# Patient Record
Sex: Female | Born: 1935 | Race: White | Hispanic: No | Smoking: Former smoker
Health system: Southern US, Community
[De-identification: ages and names within clinical notes are randomized; demographics above are authoritative.]

## PROBLEM LIST (undated history)

## (undated) DIAGNOSIS — R42 Dizziness and giddiness: Secondary | ICD-10-CM

## (undated) DIAGNOSIS — I4891 Unspecified atrial fibrillation: Secondary | ICD-10-CM

## (undated) DIAGNOSIS — C8590 Non-Hodgkin lymphoma, unspecified, unspecified site: Secondary | ICD-10-CM

## (undated) DIAGNOSIS — D352 Benign neoplasm of pituitary gland: Secondary | ICD-10-CM

## (undated) DIAGNOSIS — J449 Chronic obstructive pulmonary disease, unspecified: Secondary | ICD-10-CM

## (undated) DIAGNOSIS — K2270 Barrett's esophagus without dysplasia: Secondary | ICD-10-CM

## (undated) HISTORY — PX: PITUITARY SURGERY: SHX203

## (undated) HISTORY — PX: ORTHOPEDIC SURGERY: SHX850

## (undated) HISTORY — PX: ABDOMINAL HYSTERECTOMY: SHX81

---

## 2014-08-13 ENCOUNTER — Emergency Department: Payer: Medicare Other

## 2014-08-13 ENCOUNTER — Encounter: Payer: Self-pay | Admitting: *Deleted

## 2014-08-13 ENCOUNTER — Emergency Department
Admission: EM | Admit: 2014-08-13 | Discharge: 2014-08-13 | Disposition: A | Discharge status: Home / Self Care | Payer: Medicare Other | Attending: Emergency Medicine | Admitting: Emergency Medicine

## 2014-08-13 DIAGNOSIS — Z7951 Long term (current) use of inhaled steroids: Secondary | ICD-10-CM | POA: Diagnosis not present

## 2014-08-13 DIAGNOSIS — J189 Pneumonia, unspecified organism: Secondary | ICD-10-CM | POA: Diagnosis not present

## 2014-08-13 DIAGNOSIS — J449 Chronic obstructive pulmonary disease, unspecified: Secondary | ICD-10-CM | POA: Insufficient documentation

## 2014-08-13 DIAGNOSIS — Z880 Allergy status to penicillin: Secondary | ICD-10-CM | POA: Insufficient documentation

## 2014-08-13 DIAGNOSIS — Z7952 Long term (current) use of systemic steroids: Secondary | ICD-10-CM | POA: Insufficient documentation

## 2014-08-13 DIAGNOSIS — R0602 Shortness of breath: Secondary | ICD-10-CM | POA: Diagnosis present

## 2014-08-13 DIAGNOSIS — J181 Lobar pneumonia, unspecified organism: Secondary | ICD-10-CM

## 2014-08-13 DIAGNOSIS — Z79899 Other long term (current) drug therapy: Secondary | ICD-10-CM | POA: Insufficient documentation

## 2014-08-13 DIAGNOSIS — Z791 Long term (current) use of non-steroidal anti-inflammatories (NSAID): Secondary | ICD-10-CM | POA: Insufficient documentation

## 2014-08-13 HISTORY — DX: Non-Hodgkin lymphoma, unspecified, unspecified site: C85.90

## 2014-08-13 HISTORY — DX: Dizziness and giddiness: R42

## 2014-08-13 HISTORY — DX: Barrett's esophagus without dysplasia: K22.70

## 2014-08-13 HISTORY — DX: Unspecified atrial fibrillation: I48.91

## 2014-08-13 HISTORY — DX: Chronic obstructive pulmonary disease, unspecified: J44.9

## 2014-08-13 HISTORY — DX: Benign neoplasm of pituitary gland: D35.2

## 2014-08-13 LAB — COMPREHENSIVE METABOLIC PANEL
ALT: 22 U/L (ref 14–54)
ANION GAP: 9 (ref 5–15)
AST: 41 U/L (ref 15–41)
Albumin: 3 g/dL — ABNORMAL LOW (ref 3.5–5.0)
Alkaline Phosphatase: 192 U/L — ABNORMAL HIGH (ref 38–126)
BUN: 6 mg/dL (ref 6–20)
CO2: 29 mmol/L (ref 22–32)
Calcium: 8.2 mg/dL — ABNORMAL LOW (ref 8.9–10.3)
Chloride: 100 mmol/L — ABNORMAL LOW (ref 101–111)
Creatinine, Ser: 0.6 mg/dL (ref 0.44–1.00)
GFR calc Af Amer: >60 mL/min (ref >60)
GFR calc non Af Amer: >60 mL/min (ref >60)
Glucose, Bld: 119 mg/dL — ABNORMAL HIGH (ref 65–99)
Potassium: 3.4 mmol/L — ABNORMAL LOW (ref 3.5–5.1)
Sodium: 138 mmol/L (ref 135–145)
Total Bilirubin: 0.6 mg/dL (ref 0.3–1.2)
Total Protein: 6.6 g/dL (ref 6.5–8.1)

## 2014-08-13 LAB — LACTIC ACID, PLASMA: LACTIC ACID, VENOUS: 1.2 mmol/L (ref 0.5–2.0)

## 2014-08-13 LAB — CBC
HEMATOCRIT: 40.3 % (ref 35.0–47.0)
HEMOGLOBIN: 12.6 g/dL (ref 12.0–16.0)
MCH: 25.8 pg — ABNORMAL LOW (ref 26.0–34.0)
MCHC: 31.2 g/dL — ABNORMAL LOW (ref 32.0–36.0)
MCV: 82.7 fL (ref 80.0–100.0)
Platelets: 297 10*3/uL (ref 150–440)
RBC: 4.87 MIL/uL (ref 3.80–5.20)
RDW: 19.4 % — ABNORMAL HIGH (ref 11.5–14.5)
WBC: 10.1 10*3/uL (ref 3.6–11.0)

## 2014-08-13 LAB — BRAIN NATRIURETIC PEPTIDE: B Natriuretic Peptide: 231 pg/mL — ABNORMAL HIGH (ref 0.0–100.0)

## 2014-08-13 LAB — TROPONIN I: Troponin I: <0.03 ng/mL (ref <0.031)

## 2014-08-13 MED ORDER — DEXTROSE 5 % IV SOLN
2.0000 g | Freq: Two times a day (BID) | INTRAVENOUS | Status: DC
  Administered 2014-08-13: 2 g via INTRAVENOUS
  Filled 2014-08-13 (×3): qty 2

## 2014-08-13 MED ORDER — IPRATROPIUM-ALBUTEROL 0.5-2.5 (3) MG/3ML IN SOLN
3.0000 mL | Freq: Once | RESPIRATORY_TRACT | Status: AC
  Administered 2014-08-13: 3 mL via RESPIRATORY_TRACT

## 2014-08-13 MED ORDER — ONDANSETRON 4 MG PO TBDP
4.0000 mg | ORAL_TABLET | Freq: Once | ORAL | Status: AC
  Administered 2014-08-13: 4 mg via ORAL

## 2014-08-13 MED ORDER — ONDANSETRON 4 MG PO TBDP
ORAL_TABLET | ORAL | Status: AC
  Administered 2014-08-13: 4 mg via ORAL
  Filled 2014-08-13: qty 1

## 2014-08-13 MED ORDER — IPRATROPIUM-ALBUTEROL 0.5-2.5 (3) MG/3ML IN SOLN
RESPIRATORY_TRACT | Status: AC
  Administered 2014-08-13: 3 mL via RESPIRATORY_TRACT
  Filled 2014-08-13: qty 3

## 2014-08-13 MED ORDER — ONDANSETRON HCL 4 MG/2ML IJ SOLN
4.0000 mg | Freq: Once | INTRAMUSCULAR | Status: AC
  Administered 2014-08-13: 4 mg via INTRAVENOUS

## 2014-08-13 MED ORDER — ONDANSETRON HCL 4 MG/2ML IJ SOLN
INTRAMUSCULAR | Status: AC
  Administered 2014-08-13: 4 mg via INTRAVENOUS
  Filled 2014-08-13: qty 2

## 2014-08-13 MED ORDER — VANCOMYCIN HCL IN DEXTROSE 1-5 GM/200ML-% IV SOLN
1000.0000 mg | Freq: Once | INTRAVENOUS | Status: AC
  Administered 2014-08-13: 1000 mg via INTRAVENOUS

## 2014-08-13 MED ORDER — VANCOMYCIN HCL IN DEXTROSE 1-5 GM/200ML-% IV SOLN
INTRAVENOUS | Status: AC
  Administered 2014-08-13: 1000 mg via INTRAVENOUS
  Filled 2014-08-13: qty 200

## 2014-08-13 MED ORDER — IOHEXOL 300 MG/ML  SOLN
100.0000 mL | Freq: Once | INTRAMUSCULAR | Status: AC | PRN
  Administered 2014-08-13: 75 mL via INTRAVENOUS

## 2014-08-13 NOTE — ED Notes (Signed)
Pt cleaned and bed sheets changed 

## 2014-08-13 NOTE — Discharge Instructions (Signed)
You're being treated for healthcare acquired right lower lobe pneumonia. Use oxygen as needed to keep oxygen levels above 93%. Continue IV antibiotics. You need to be evaluated by Dr. Lovie Macadamia tomorrow. Return to the emergency department for any altered mental status, trouble breathing, chest pain, low blood pressure such as a systolic blood pressure less than 100, or fever.

## 2014-08-13 NOTE — ED Notes (Signed)
Pt awaiting EMS pickup

## 2014-08-13 NOTE — ED Provider Notes (Signed)
[facility] Emergency Department Provider Note   ____________________________________________  Time seen: On EMS arrival I have reviewed the triage vital signs and the triage nursing note.  HISTORY  Chief Complaint Shortness of Breath   Historian Patient although somewhat limited due to the dyspnea  HPI Tricia Miller is a 81 y.o. female who has a history of COPD and A. fib, who was recently admitted in the hospital for vertigo, who is coming in for evaluation with a complaint of shortness of breath since this morning. Per EMS she was 85% on room air, and is 100% on a nonrebreather. He shouldn't states she is very nauseated and has shortness of breath. She thinks the shortness of breath has been coming on over a matter of weeks. She denies any chest pain. She's has a bit of a dry cough, but no sputum production. She's not had a fever. She is currently feeling nauseated as well. This doesn't feel similar to when she had vertigo previously. Symptoms are moderate.    Past Medical History  Diagnosis Date  . COPD (chronic obstructive pulmonary disease)   . Vertigo   . Barrett esophagus   . Lymphoma of lymph nodes   . A-fib   . Pituitary macroadenoma     There are no active problems to display for this patient.   History reviewed. No pertinent past surgical history.  Current Outpatient Rx  Name  Route  Sig  Dispense  Refill  . albuterol (PROVENTIL HFA;VENTOLIN HFA) 108 (90 BASE) MCG/ACT inhaler   Inhalation   Inhale 2 puffs into the lungs every 4 (four) hours as needed for wheezing or shortness of breath.         Marland Kitchen albuterol (PROVENTIL) (2.5 MG/3ML) 0.083% nebulizer solution   Nebulization   Take 2.5 mg by nebulization every 4 (four) hours as needed for wheezing or shortness of breath.         . diclofenac sodium (VOLTAREN) 1 % GEL   Topical   Apply 2 g topically 4 (four) times daily.         . fluticasone (FLONASE) 50 MCG/ACT nasal  spray   Each Nare   Place 2 sprays into both nostrils daily.         . furosemide (LASIX) 20 MG tablet   Oral   Take 20 mg by mouth daily as needed for edema.         Marland Kitchen ibuprofen (ADVIL,MOTRIN) 800 MG tablet   Oral   Take 800 mg by mouth every 8 (eight) hours as needed for mild pain.         Marland Kitchen ipratropium (ATROVENT HFA) 17 MCG/ACT inhaler   Inhalation   Inhale 2 puffs into the lungs every 6 (six) hours.         Marland Kitchen levothyroxine (SYNTHROID, LEVOTHROID) 25 MCG tablet   Oral   Take 25 mcg by mouth every morning.         . magnesium oxide (MAG-OX) 400 MG tablet   Oral   Take 400 mg by mouth daily.         . metoprolol succinate (TOPROL-XL) 25 MG 24 hr tablet   Oral   Take 12.5 mg by mouth daily.         Marland Kitchen omeprazole (PRILOSEC) 40 MG capsule   Oral   Take 40 mg by mouth every morning.         . primidone (MYSOLINE) 50 MG tablet   Oral  Take 50 mg by mouth 2 (two) times daily.         . promethazine (PHENERGAN) 25 MG tablet   Oral   Take 25 mg by mouth every 6 (six) hours as needed for nausea or vomiting.         Marland Kitchen scopolamine (TRANSDERM-SCOP) 1 MG/3DAYS   Transdermal   Place 1 patch onto the skin every 3 (three) days.         Marland Kitchen sertraline (ZOLOFT) 100 MG tablet   Oral   Take 200 mg by mouth daily.         Marland Kitchen triamcinolone (KENALOG) 0.025 % cream   Topical   Apply 1 application topically 2 (two) times daily.           Allergies Bactrim; Ciprofloxacin; Codeine; Felodipine; Levaquin; Morphine and related; Penicillins; Pregabalin; and Trimethoprim  History reviewed. No pertinent family history.  Social History History  Substance Use Topics  . Smoking status: Unknown If Ever Smoked  . Smokeless tobacco: Not on file  . Alcohol Use: Not on file    Review of Systems  Constitutional: Negative for fever. Eyes: Negative for visual changes. ENT: Negative for sore throat. Cardiovascular: Negative for chest pain. Respiratory: Positive  per history of present illness Gastrointestinal: Negative for abdominal pain, vomiting and diarrhea. Genitourinary: Negative for dysuria. Musculoskeletal: Negative for back pain. Skin: Negative for rash. Neurological: Negative for headaches, focal weakness or numbness. 10 point Review of Systems otherwise negative ____________________________________________   PHYSICAL EXAM:  VITAL SIGNS: ED Triage Vitals  Enc Vitals Group     BP 08/13/14 1131 102/69 mmHg     Pulse Rate 08/13/14 1131 96     Resp 08/13/14 1131 22     Temp 08/13/14 1132 97.6 F (36.4 C)     Temp Source 08/13/14 1131 Oral     SpO2 08/13/14 1131 100 %     Weight 08/13/14 1131 212 lb 15.4 oz (96.599 kg)     Height 08/13/14 1131 5\' 6"  (1.676 m)     Head Cir --      Peak Flow --      Pain Score 08/13/14 1131 5     Pain Loc --      Pain Edu? --      Excl. in [place]? --      Constitutional: Alert and cooperative. Wearing nonrebreather but in no acute respiratory distress.. Eyes: Conjunctivae are normal. PERRL. Normal extraocular movements. ENT   Head: Normocephalic and atraumatic.   Nose: No congestion/rhinnorhea.   Mouth/Throat: Mucous membranes are moderately dry.   Neck: No stridor. Cardiovascular/Chest: Normal rate, regular rhythm.  No murmurs, rubs, or gallops. Respiratory: Decreased air movement throughout all lung fields with a mild and expiratory wheeze. No rhonchi. No tachypnea. Gastrointestinal: Soft. No distention, no guarding, no rebound. Nontender. Obese  Genitourinary/rectal:Deferred Musculoskeletal: Nontender with normal range of motion in all extremities. No joint effusions.  No lower extremity tenderness nor edema. Neurologic:  Normal speech and language. No gross focal neurologic deficits are appreciated. Skin:  Skin is warm, dry and intact. No rash noted. Psychiatric: Mood and affect are normal. Speech and behavior are normal. Patient exhibits appropriate insight and  judgment.  ____________________________________________   EKG I, Lisa Roca, MD, the attending physician have personally viewed and interpreted all ECGs.  93 bpm. Sinus rhythm with PAC. Narrow QRS. Normal axis. Nonspecific T wave. ____________________________________________  LABS (pertinent positives/negatives)  White blood count 10.1, hemoglobin 12.6 Potassium 3.4 and other electro lites  without significant abnormality Troponin less than 0.03 Lactate 1.2 ____________________________________________  RADIOLOGY All Xrays were viewed by me. Imaging interpreted by Radiologist. Discussed with [facility] radiologist staff regarding recommendation of the chest CT after x-ray concerning for neoplasm of the lung  Chest portable:  IMPRESSION: Cardiomegaly with features suggesting emphysema.  Right hilar and suprahilar opacity. Neoplasm is a concern. CT chest with contrast recommended to further evaluate.  These results will be called to the ordering clinician or representative by the Radiologist Assistant, and communication documented in the PACS or zVision Dashboard.  Chest CT:  IMPRESSION: No evidence of pulmonary emboli.  Superior segment right lower lobe infiltrate.  Mild bibasilar atelectasis with small effusions.  Cholelithiasis.  Right renal cystic change. __________________________________________  PROCEDURES  Procedure(s) performed: None Critical Care performed: None  ____________________________________________   ED COURSE / ASSESSMENT AND PLAN  CONSULTATIONS: Phone consultation with Dr. Lovie Macadamia, the physician at the [facility] facility. Dr. Lovie Macadamia agrees to see the patient tomorrow.  Phone discussion with Mariann Laster from nursing staff at [facility] who coordinated receipt of faxed orders for IV antibiotics.  Pertinent labs & imaging results that were available during my care of the patient were reviewed by me and considered in my  medical decision making (see chart for details).  Patient was hypoxic and dyspneic with some coughing and mild wheezing. Patient does have a history of COPD, and did improve somewhat after oxygen administration and a DuoNeb. She did not develop any more wheezing. Her x-ray was concerning for consolidation and a CT was obtained which showed no concern for mass, and no PE, but a right lower lobe pneumonia. Given that the patient was recently discharged from a hospital, she will be treated for healthcare acquired pneumonia. Patient has allergies to multiple antibiotics category is, however her daughter states she has had no problems tolerating cefepime and Rocephin in the past. She will be given vancomycin and ceftaz. The patient has had no hypotension, no elevated white blood count, no fever, and no tachycardia, with a normal lactate, stable on 2 L nasal cannula, and no return of wheezing or trouble breathing, so I think she is stable to be treated at [facility]. I will leave her IV in place. I discussed with Dr. Lovie Macadamia who is the facility physician and will see her tomorrow. I discussed with the nursing staff and they are able to provide IV antibiotic therapy, and they are able to obtain her antibiotics for her next dose is tonight. I updated the daughter and the patient and they're comfortable with this plan as well.  Orders were given to continue oxygen nasal cannula when necessary to keep oxygen sat above 93%. Ceftaz amine 2 g every 8 hours IV. Vancomycin 1 g every 12 hours IV. Another was given for vancomycin trough blood draw prior to fourth dose.  Patient / Family / Caregiver informed of clinical course, medical decision-making process, and agree with plan.   I discussed return precautions, follow-up instructions, and discharged instructions with patient and/or family.  ___________________________________________   FINAL CLINICAL IMPRESSION(S) / ED DIAGNOSES   Final diagnoses:  Right  lower lobe pneumonia    FOLLOW UP  Referred to:  Dr. Cheral Marker, MD 08/13/14 [phone]

## 2014-08-13 NOTE — ED Notes (Signed)
Pt placed on 2L [place]

## 2014-08-13 NOTE — ED Notes (Signed)
Pt discharged with IV in place per Dr. Reita Cliche

## 2014-08-13 NOTE — ED Notes (Signed)
pt arrives via EMS with complaints of SOB since this AM, pt from [facility] as of yesterday, pt arrives on 15L [place], pt was 85% on RA upon EMS arrival, pt was recently hospitalized for weakness and vertigo

## 2014-08-13 NOTE — ED Notes (Signed)
PT PLACED ON 2l [place]

## 2014-08-15 ENCOUNTER — Other Ambulatory Visit
Admission: RE | Admit: 2014-08-15 | Discharge: 2014-08-15 | Disposition: A | Discharge status: Home / Self Care | Payer: Medicare Other | Source: Skilled Nursing Facility | Attending: Family Medicine | Admitting: Family Medicine

## 2014-08-15 DIAGNOSIS — J189 Pneumonia, unspecified organism: Secondary | ICD-10-CM | POA: Insufficient documentation

## 2014-08-15 DIAGNOSIS — I10 Essential (primary) hypertension: Secondary | ICD-10-CM | POA: Insufficient documentation

## 2014-08-15 LAB — CREATININE, SERUM
CREATININE: 0.7 mg/dL (ref 0.44–1.00)
GFR calc Af Amer: >60 mL/min (ref >60)

## 2014-08-15 LAB — VANCOMYCIN, TROUGH: Vancomycin Tr: 8 ug/mL — ABNORMAL LOW (ref 10–20)

## 2014-08-17 ENCOUNTER — Other Ambulatory Visit
Admission: RE | Admit: 2014-08-17 | Discharge: 2014-08-17 | Disposition: A | Discharge status: Home / Self Care | Payer: Medicare Other | Source: Other Acute Inpatient Hospital | Attending: Family Medicine | Admitting: Family Medicine

## 2014-08-18 ENCOUNTER — Inpatient Hospital Stay
Admission: EM | Admit: 2014-08-18 | Discharge: 2014-08-24 | DRG: 191 | Disposition: A | Discharge status: Skilled Nursing Facility | Payer: Medicare Other | Attending: Internal Medicine | Admitting: Internal Medicine

## 2014-08-18 ENCOUNTER — Emergency Department: Payer: Medicare Other

## 2014-08-18 DIAGNOSIS — K222 Esophageal obstruction: Secondary | ICD-10-CM | POA: Diagnosis present

## 2014-08-18 DIAGNOSIS — K5900 Constipation, unspecified: Secondary | ICD-10-CM | POA: Diagnosis present

## 2014-08-18 DIAGNOSIS — Z87891 Personal history of nicotine dependence: Secondary | ICD-10-CM

## 2014-08-18 DIAGNOSIS — R1310 Dysphagia, unspecified: Secondary | ICD-10-CM | POA: Diagnosis not present

## 2014-08-18 DIAGNOSIS — I480 Paroxysmal atrial fibrillation: Secondary | ICD-10-CM | POA: Diagnosis present

## 2014-08-18 DIAGNOSIS — D497 Neoplasm of unspecified behavior of endocrine glands and other parts of nervous system: Secondary | ICD-10-CM | POA: Diagnosis present

## 2014-08-18 DIAGNOSIS — J441 Chronic obstructive pulmonary disease with (acute) exacerbation: Principal | ICD-10-CM | POA: Diagnosis present

## 2014-08-18 DIAGNOSIS — E876 Hypokalemia: Secondary | ICD-10-CM | POA: Diagnosis present

## 2014-08-18 DIAGNOSIS — K219 Gastro-esophageal reflux disease without esophagitis: Secondary | ICD-10-CM | POA: Diagnosis present

## 2014-08-18 DIAGNOSIS — Z9981 Dependence on supplemental oxygen: Secondary | ICD-10-CM | POA: Diagnosis not present

## 2014-08-18 DIAGNOSIS — J9610 Chronic respiratory failure, unspecified whether with hypoxia or hypercapnia: Secondary | ICD-10-CM | POA: Diagnosis present

## 2014-08-18 DIAGNOSIS — R0600 Dyspnea, unspecified: Secondary | ICD-10-CM

## 2014-08-18 DIAGNOSIS — Z880 Allergy status to penicillin: Secondary | ICD-10-CM | POA: Diagnosis not present

## 2014-08-18 DIAGNOSIS — R42 Dizziness and giddiness: Secondary | ICD-10-CM

## 2014-08-18 LAB — CBC WITH DIFFERENTIAL/PLATELET
BASOS PCT: 1 %
Basophils Absolute: 0.1 10*3/uL (ref 0–0.1)
EOS PCT: 2 %
Eosinophils Absolute: 0.2 10*3/uL (ref 0–0.7)
HCT: 36.2 % (ref 35.0–47.0)
Hemoglobin: 11.4 g/dL — ABNORMAL LOW (ref 12.0–16.0)
LYMPHS PCT: 17 %
Lymphs Abs: 1.9 10*3/uL (ref 1.0–3.6)
MCH: 25.6 pg — ABNORMAL LOW (ref 26.0–34.0)
MCHC: 31.4 g/dL — ABNORMAL LOW (ref 32.0–36.0)
MCV: 81.6 fL (ref 80.0–100.0)
Monocytes Absolute: 0.9 10*3/uL (ref 0.2–0.9)
Monocytes Relative: 8 %
Neutro Abs: 8 10*3/uL — ABNORMAL HIGH (ref 1.4–6.5)
Neutrophils Relative %: 72 %
Platelets: 290 10*3/uL (ref 150–440)
RBC: 4.43 MIL/uL (ref 3.80–5.20)
RDW: 19.3 % — AB (ref 11.5–14.5)
WBC: 11.1 10*3/uL — ABNORMAL HIGH (ref 3.6–11.0)

## 2014-08-18 LAB — CULTURE, BLOOD (ROUTINE X 2)
CULTURE: NO GROWTH
CULTURE: NO GROWTH

## 2014-08-18 LAB — BASIC METABOLIC PANEL
Anion gap: 10 (ref 5–15)
CALCIUM: 7.5 mg/dL — AB (ref 8.9–10.3)
CO2: 32 mmol/L (ref 22–32)
Chloride: 95 mmol/L — ABNORMAL LOW (ref 101–111)
Creatinine, Ser: 0.5 mg/dL (ref 0.44–1.00)
GFR calc Af Amer: >60 mL/min (ref >60)
GFR calc non Af Amer: >60 mL/min (ref >60)
Glucose, Bld: 100 mg/dL — ABNORMAL HIGH (ref 65–99)
Potassium: 2.7 mmol/L — CL (ref 3.5–5.1)
Sodium: 137 mmol/L (ref 135–145)

## 2014-08-18 LAB — BRAIN NATRIURETIC PEPTIDE: B NATRIURETIC PEPTIDE 5: 254 pg/mL — AB (ref 0.0–100.0)

## 2014-08-18 LAB — TROPONIN I: Troponin I: <0.03 ng/mL (ref <0.031)

## 2014-08-18 MED ORDER — POTASSIUM CHLORIDE IN NACL 20-0.45 MEQ/L-% IV SOLN
INTRAVENOUS | Status: AC
  Filled 2014-08-18: qty 1000

## 2014-08-18 MED ORDER — DOXYCYCLINE HYCLATE 100 MG PO TABS
ORAL_TABLET | ORAL | Status: AC
  Administered 2014-08-18: 100 mg via ORAL
  Filled 2014-08-18: qty 1

## 2014-08-18 MED ORDER — IPRATROPIUM-ALBUTEROL 0.5-2.5 (3) MG/3ML IN SOLN
3.0000 mL | Freq: Four times a day (QID) | RESPIRATORY_TRACT | Status: DC
  Administered 2014-08-19 (×4): 3 mL via RESPIRATORY_TRACT
  Filled 2014-08-18 (×4): qty 3

## 2014-08-18 MED ORDER — ONDANSETRON HCL 4 MG/2ML IJ SOLN
INTRAMUSCULAR | Status: AC
  Administered 2014-08-18: 4 mg via INTRAVENOUS
  Filled 2014-08-18: qty 2

## 2014-08-18 MED ORDER — ONDANSETRON HCL 4 MG PO TABS: 4.0000 mg | ORAL_TABLET | Freq: Four times a day (QID) | ORAL | Status: DC | PRN

## 2014-08-18 MED ORDER — ACETAMINOPHEN 650 MG RE SUPP: 650.0000 mg | Freq: Four times a day (QID) | RECTAL | Status: DC | PRN

## 2014-08-18 MED ORDER — POTASSIUM CHLORIDE CRYS ER 20 MEQ PO TBCR
40.0000 meq | EXTENDED_RELEASE_TABLET | Freq: Once | ORAL | Status: DC
  Filled 2014-08-18: qty 2

## 2014-08-18 MED ORDER — METHYLPREDNISOLONE SODIUM SUCC 125 MG IJ SOLR
62.5000 mg | Freq: Once | INTRAMUSCULAR | Status: AC
  Administered 2014-08-18: 62.5 mg via INTRAVENOUS

## 2014-08-18 MED ORDER — SODIUM CHLORIDE 0.9 % IV SOLN: INTRAVENOUS | Status: DC

## 2014-08-18 MED ORDER — ACETAMINOPHEN 325 MG PO TABS
650.0000 mg | ORAL_TABLET | Freq: Four times a day (QID) | ORAL | Status: DC | PRN
  Administered 2014-08-19: 650 mg via ORAL
  Filled 2014-08-18: qty 2

## 2014-08-18 MED ORDER — ONDANSETRON HCL 4 MG/2ML IJ SOLN
4.0000 mg | Freq: Once | INTRAMUSCULAR | Status: AC
Start: 2014-08-18 — End: 2014-08-18
  Administered 2014-08-18: 4 mg via INTRAVENOUS

## 2014-08-18 MED ORDER — ONDANSETRON HCL 4 MG/2ML IJ SOLN
4.0000 mg | Freq: Four times a day (QID) | INTRAMUSCULAR | Status: DC | PRN
  Filled 2014-08-18: qty 2

## 2014-08-18 MED ORDER — DOXYCYCLINE HYCLATE 100 MG PO TABS
100.0000 mg | ORAL_TABLET | Freq: Once | ORAL | Status: AC
  Administered 2014-08-18: 100 mg via ORAL

## 2014-08-18 MED ORDER — HEPARIN SODIUM (PORCINE) 5000 UNIT/ML IJ SOLN
5000.0000 [IU] | Freq: Three times a day (TID) | INTRAMUSCULAR | Status: DC
  Administered 2014-08-19 – 2014-08-24 (×16): 5000 [IU] via SUBCUTANEOUS
  Filled 2014-08-18 (×17): qty 1

## 2014-08-18 MED ORDER — METHYLPREDNISOLONE SODIUM SUCC 125 MG IJ SOLR
INTRAMUSCULAR | Status: AC
  Administered 2014-08-18: 62.5 mg via INTRAVENOUS
  Filled 2014-08-18: qty 2

## 2014-08-18 MED ORDER — POTASSIUM CHLORIDE IN NACL 20-0.9 MEQ/L-% IV SOLN
INTRAVENOUS | Status: AC
Start: 2014-08-18 — End: 2014-08-19
  Filled 2014-08-18: qty 1000

## 2014-08-18 MED ORDER — POTASSIUM CHLORIDE IN NACL 20-0.9 MEQ/L-% IV SOLN
Freq: Once | INTRAVENOUS | Status: AC
  Administered 2014-08-18: 1000 mL via INTRAVENOUS

## 2014-08-18 MED ORDER — POTASSIUM CHLORIDE 20 MEQ PO PACK
20.0000 meq | PACK | Freq: Once | ORAL | Status: AC
  Administered 2014-08-18: 20 meq via ORAL

## 2014-08-18 MED ORDER — METHYLPREDNISOLONE SODIUM SUCC 125 MG IJ SOLR
60.0000 mg | INTRAMUSCULAR | Status: DC
  Administered 2014-08-19: 11:00:00 60 mg via INTRAVENOUS
  Administered 2014-08-20: 09:00:00 via INTRAVENOUS
  Administered 2014-08-21 – 2014-08-23 (×3): 60 mg via INTRAVENOUS
  Filled 2014-08-18 (×6): qty 2

## 2014-08-18 MED ORDER — DEXTROSE 5 % IV SOLN
500.0000 mg | INTRAVENOUS | Status: DC
  Administered 2014-08-19 – 2014-08-20 (×3): 500 mg via INTRAVENOUS
  Filled 2014-08-18 (×4): qty 500

## 2014-08-18 MED ORDER — IPRATROPIUM-ALBUTEROL 0.5-2.5 (3) MG/3ML IN SOLN
3.0000 mL | Freq: Once | RESPIRATORY_TRACT | Status: AC
  Administered 2014-08-18: 3 mL via RESPIRATORY_TRACT

## 2014-08-18 MED ORDER — POTASSIUM CHLORIDE 20 MEQ PO PACK
PACK | ORAL | Status: AC
  Administered 2014-08-18: 20 meq via ORAL
  Filled 2014-08-18: qty 1

## 2014-08-18 MED ORDER — MORPHINE SULFATE 2 MG/ML IJ SOLN
2.0000 mg | INTRAMUSCULAR | Status: DC | PRN
  Administered 2014-08-19 – 2014-08-21 (×7): 2 mg via INTRAVENOUS
  Filled 2014-08-18 (×7): qty 1

## 2014-08-18 MED ORDER — IPRATROPIUM-ALBUTEROL 0.5-2.5 (3) MG/3ML IN SOLN
RESPIRATORY_TRACT | Status: AC
  Administered 2014-08-18: 3 mL via RESPIRATORY_TRACT
  Filled 2014-08-18: qty 3

## 2014-08-18 NOTE — ED Provider Notes (Signed)
[facility] Emergency Department Provider Note  ____________________________________________  Time seen: 62  I have reviewed the triage vital signs and the nursing notes.   HISTORY  Chief Complaint Weakness  short of breath, recent pneumonia    HPI Tricia Miller is a 81 y.o. female who is staying at [facility] for rehabilitation. She was diagnosed with a pneumonia in her right upper lung approximately one week ago. The patient reports she is continuing to have significant shortness of breath and is uncomfortable. There is no report of fever from the facility. The patient's daughter denies fever as well. The patient has been on antibiotics, but is unclear if she is on steroids. Patient reports she has used a nebulizer once and it made her choke. It does not sound like she is getting regular beta agonist treatment.   The patient has a history of COPD area. She reports she feels weak but she denies chest pain. There is no nausea or vomiting.    Past Medical History  Diagnosis Date  . COPD (chronic obstructive pulmonary disease)   . Vertigo   . Barrett esophagus   . Lymphoma of lymph nodes   . A-fib   . Pituitary macroadenoma     There are no active problems to display for this patient.   Past Surgical History  Procedure Laterality Date  . Abdominal hysterectomy    . Pituitary surgery    . Orthopedic surgery      Current Outpatient Rx  Name  Route  Sig  Dispense  Refill  . albuterol (PROVENTIL HFA;VENTOLIN HFA) 108 (90 BASE) MCG/ACT inhaler   Inhalation   Inhale 2 puffs into the lungs every 4 (four) hours as needed for wheezing or shortness of breath.         Marland Kitchen albuterol (PROVENTIL) (2.5 MG/3ML) 0.083% nebulizer solution   Nebulization   Take 2.5 mg by nebulization every 4 (four) hours as needed for wheezing or shortness of breath.         . diclofenac sodium (VOLTAREN) 1 % GEL   Topical   Apply 2 g topically 4 (four) times  daily.         . fluticasone (FLONASE) 50 MCG/ACT nasal spray   Each Nare   Place 2 sprays into both nostrils daily.         . furosemide (LASIX) 20 MG tablet   Oral   Take 20 mg by mouth daily as needed for edema.         Marland Kitchen ibuprofen (ADVIL,MOTRIN) 800 MG tablet   Oral   Take 800 mg by mouth every 8 (eight) hours as needed for mild pain.         Marland Kitchen ipratropium (ATROVENT HFA) 17 MCG/ACT inhaler   Inhalation   Inhale 2 puffs into the lungs every 6 (six) hours.         Marland Kitchen levothyroxine (SYNTHROID, LEVOTHROID) 25 MCG tablet   Oral   Take 25 mcg by mouth every morning.         . magnesium oxide (MAG-OX) 400 MG tablet   Oral   Take 400 mg by mouth daily.         . metoprolol succinate (TOPROL-XL) 25 MG 24 hr tablet   Oral   Take 12.5 mg by mouth daily.         Marland Kitchen omeprazole (PRILOSEC) 40 MG capsule   Oral   Take 40 mg by mouth every morning.         Marland Kitchen  primidone (MYSOLINE) 50 MG tablet   Oral   Take 50 mg by mouth 2 (two) times daily.         . promethazine (PHENERGAN) 25 MG tablet   Oral   Take 25 mg by mouth every 6 (six) hours as needed for nausea or vomiting.         Marland Kitchen scopolamine (TRANSDERM-SCOP) 1 MG/3DAYS   Transdermal   Place 1 patch onto the skin every 3 (three) days.         Marland Kitchen sertraline (ZOLOFT) 100 MG tablet   Oral   Take 200 mg by mouth daily.         Marland Kitchen triamcinolone (KENALOG) 0.025 % cream   Topical   Apply 1 application topically 2 (two) times daily.           Allergies Bactrim; Ciprofloxacin; Codeine; Felodipine; Levaquin; Morphine and related; Penicillins; Pregabalin; and Trimethoprim  No family history on file.  Social History History  Substance Use Topics  . Smoking status: Former Research scientist (life sciences)  . Smokeless tobacco: Not on file  . Alcohol Use: No    Review of Systems  Constitutional: Negative for fever. ENT: Negative for sore throat. Cardiovascular: Negative for chest pain. Respiratory: Positive for shortness of  breath. Gastrointestinal: Negative for abdominal pain, vomiting and diarrhea. Genitourinary: Negative for dysuria. Musculoskeletal: No myalgias or injuries. Skin: Negative for rash. Neurological: Negative for headaches   10-point ROS otherwise negative.  ____________________________________________   PHYSICAL EXAM:  VITAL SIGNS: ED Triage Vitals  Enc Vitals Group     BP --      Pulse --      Resp --      Temp --      Temp src --      SpO2 --      Weight --      Height --      Head Cir --      Peak Flow --      Pain Score --      Pain Loc --      Pain Edu? --      Excl. in [place]? --     Constitutional:  Alert and communicative. She appears uncomfortable, short of breath, shallow breathing, but otherwise no acute distress. ENT   Head: Normocephalic and atraumatic.   Nose: No congestion/rhinnorhea.   Mouth/Throat: Mucous membranes are moist. Cardiovascular: Normal rate, regular rhythm, no murmur noted Respiratory:  Patient appears to be uncomfortable with her breathing. She has shallow breaths with diminished breath sounds bilaterally. Gastrointestinal: Soft and nontender. No distention.  Back: No muscle spasm, no tenderness, no CVA tenderness. Musculoskeletal: No deformity noted. Nontender with normal range of motion in all extremities.  No noted edema. Neurologic: Appears globally weak without focal neurologic deficit. Skin:  Skin is warm, dry. No rash noted. Psychiatric: Mood and affect are normal. Speech and behavior are normal.  ____________________________________________    LABS (pertinent positives/negatives)  Labs Reviewed  CBC WITH DIFFERENTIAL/PLATELET - Abnormal; Notable for the following:    WBC 11.1 (*)    Hemoglobin 11.4 (*)    MCH 25.6 (*)    MCHC 31.4 (*)    RDW 19.3 (*)    Neutro Abs 8.0 (*)    All other components within normal limits  BASIC METABOLIC PANEL - Abnormal; Notable for the following:    Potassium 2.7 (*)    Chloride 95  (*)    Glucose, Bld 100 (*)    BUN <5 (*)  Calcium 7.5 (*)    All other components within normal limits  BRAIN NATRIURETIC PEPTIDE - Abnormal; Notable for the following:    B Natriuretic Peptide 254.0 (*)    All other components within normal limits  TROPONIN I     ____________________________________________   EKG  ED ECG REPORT I, Carlin Mamone W, the attending physician, personally viewed and interpreted this ECG.   Date: 08/18/2014  EKG Time: 2218  Rate: 96  Rhythm: Normal sinus rhythm  Axis: Normal  Intervals:PR 208, QTC of 477 - prolonged QT  ST&T Change: None noted   ____________________________________________    RADIOLOGY  Chest x-ray IMPRESSION: Hyperinflation/COPD, without acute superimposed process. Please note that the right lower lobe opacity on the prior CT would likely not be plain film visible.  Aortic atherosclerosis.  Pulmonary artery enlargement suggests pulmonary arterial hypertension.  ____________________________________________   ____________________________________________   INITIAL IMPRESSION / ASSESSMENT AND PLAN / ED COURSE  Pertinent labs & imaging results that were available during my care of the patient were reviewed by me and considered in my medical decision making (see chart for details).  Somewhat ill appearing 81 year old lady with multiple hospital visits and ongoing shortness of breath. She appears to be having some increased work of breathing and dyspnea. Chest x-ray pending.  ----------------------------------------- 10:00 PM on 08/18/2014 -----------------------------------------  Chest x-ray is consistent with hyperinflation and COPD but no sign of pneumonia. The radiologist notes that the prior open 3 seen on CT would not likely be seen on the plain film.  Patient was treated with a nebulizer treatment. This made mild difference. The patient still appears uncomfortable. We'll treat her with another breathing  treatment and antibiotics and slight Medrol. Patient has a low potassium level at 2.7.  I will ask the internal medicine team to evaluate for admission of the hospital.  ----------------------------------------- 10:24 PM on 08/18/2014 -----------------------------------------  I discussed the situation with Dr. Lavetta Nielsen who will see the patient emergency department.  ____________________________________________   FINAL CLINICAL IMPRESSION(S) / ED DIAGNOSES  Final diagnoses:  Hypokalemia  COPD exacerbation  Dyspnea      Ahmed Prima, MD 08/18/14 2224

## 2014-08-18 NOTE — H&P (Signed)
[name] NAME: Tricia Miller    MR#:  [id number]  DATE OF BIRTH:  April 30, 1929   DATE OF ADMISSION:  08/18/2014  PRIMARY CARE PHYSICIAN: Tricia Callander, MD   REQUESTING/REFERRING PHYSICIAN: Thomasene Miller  CHIEF COMPLAINT:   Chief Complaint  Patient presents with  . Weakness    HISTORY OF PRESENT ILLNESS:  Tricia Miller  is a 81 y.o. female with a known history of COPD, recently placed on oxygen 1 week after diagnosis of pneumonia, paroxysmal A. fib presenting with shortness of breath. Patient is here complaining of shortness of breath worsening over the last 2 days duration with associated productive cough yellow/pink sputum, no fevers/chills. Associated nausea/vomiting/diarrhea multiple bouts of nonbloody nonbilious emesis, watery diarrhea 1-2 days total duration. She complains of generalized fatigue and weakness.  PAST MEDICAL HISTORY:   Past Medical History  Diagnosis Date  . COPD (chronic obstructive pulmonary disease)   . Vertigo   . Barrett esophagus   . Lymphoma of lymph nodes   . A-fib   . Pituitary macroadenoma     PAST SURGICAL HISTORY:   Past Surgical History  Procedure Laterality Date  . Abdominal hysterectomy    . Pituitary surgery    . Orthopedic surgery      SOCIAL HISTORY:   History  Substance Use Topics  . Smoking status: Former Research scientist (life sciences)  . Smokeless tobacco: Not on file  . Alcohol Use: No    FAMILY HISTORY:   Family History  Problem Relation Age of Onset  . Diabetes Neg Hx     DRUG ALLERGIES:   Allergies  Allergen Reactions  . Bactrim [Sulfamethoxazole-Trimethoprim]   . Ciprofloxacin   . Codeine   . Felodipine   . Levaquin [Levofloxacin In D5w]   . Morphine And Related   . Penicillins   . Pregabalin   . Trimethoprim     REVIEW OF SYSTEMS:  REVIEW OF SYSTEMS:  CONSTITUTIONAL: Denies fevers, chills, positive fatigue, weakness.  EYES: Denies blurred vision,  double vision, or eye pain.  EARS, NOSE, THROAT: Denies tinnitus, ear pain, hearing loss.  RESPIRATORY: Positive cough, shortness of breath, wheezing  CARDIOVASCULAR: Denies chest pain, palpitations, edema, orthopnea GASTROINTESTINAL: Positive nausea, vomiting, diarrhea, denies abdominal pain.  GENITOURINARY: Denies dysuria, hematuria.  ENDOCRINE: Denies nocturia or thyroid problems. HEMATOLOGIC AND LYMPHATIC: Denies easy bruising or bleeding.  SKIN: Denies rash or lesions.  MUSCULOSKELETAL: Denies pain in neck, back, shoulder, knees, hips, or further arthritic symptoms.  NEUROLOGIC: Denies paralysis, paresthesias.  PSYCHIATRIC: Denies anxiety or depressive symptoms. Otherwise full review of systems performed by me is negative.   MEDICATIONS AT HOME:   Prior to Admission medications   Medication Sig Start Date End Date Taking? Authorizing Provider  albuterol (PROVENTIL HFA;VENTOLIN HFA) 108 (90 BASE) MCG/ACT inhaler Inhale 2 puffs into the lungs every 4 (four) hours as needed for wheezing or shortness of breath.    Historical Provider, MD  albuterol (PROVENTIL) (2.5 MG/3ML) 0.083% nebulizer solution Take 2.5 mg by nebulization every 4 (four) hours as needed for wheezing or shortness of breath.    Historical Provider, MD  diclofenac sodium (VOLTAREN) 1 % GEL Apply 2 g topically 4 (four) times daily.    Historical Provider, MD  fluticasone (FLONASE) 50 MCG/ACT nasal spray Place 2 sprays into both nostrils daily.    Historical Provider, MD  furosemide (LASIX) 20 MG tablet Take 20 mg by mouth daily as needed for edema.  Historical Provider, MD  ibuprofen (ADVIL,MOTRIN) 800 MG tablet Take 800 mg by mouth every 8 (eight) hours as needed for mild pain.    Historical Provider, MD  ipratropium (ATROVENT HFA) 17 MCG/ACT inhaler Inhale 2 puffs into the lungs every 6 (six) hours.    Historical Provider, MD  levothyroxine (SYNTHROID, LEVOTHROID) 25 MCG tablet Take 25 mcg by mouth every morning.     Historical Provider, MD  magnesium oxide (MAG-OX) 400 MG tablet Take 400 mg by mouth daily.    Historical Provider, MD  metoprolol succinate (TOPROL-XL) 25 MG 24 hr tablet Take 12.5 mg by mouth daily.    Historical Provider, MD  omeprazole (PRILOSEC) 40 MG capsule Take 40 mg by mouth every morning.    Historical Provider, MD  primidone (MYSOLINE) 50 MG tablet Take 50 mg by mouth 2 (two) times daily.    Historical Provider, MD  promethazine (PHENERGAN) 25 MG tablet Take 25 mg by mouth every 6 (six) hours as needed for nausea or vomiting.    Historical Provider, MD  scopolamine (TRANSDERM-SCOP) 1 MG/3DAYS Place 1 patch onto the skin every 3 (three) days.    Historical Provider, MD  sertraline (ZOLOFT) 100 MG tablet Take 200 mg by mouth daily.    Historical Provider, MD  triamcinolone (KENALOG) 0.025 % cream Apply 1 application topically 2 (two) times daily.    Historical Provider, MD      VITAL SIGNS:  Blood pressure 121/71, pulse 46, resp. rate 18, SpO2 99 %.  PHYSICAL EXAMINATION:  VITAL SIGNS: Filed Vitals:   08/18/14 2200  BP: 121/71  Pulse: 46  Resp: 36   GENERAL:81 y.o.female currently in minimal distress given respiratory status  HEAD: Normocephalic, atraumatic.  EYES: Pupils equal, round, reactive to light. Extraocular muscles intact. No scleral icterus.  MOUTH: Dry mucosal membrane. Dentition intact. No abscess noted.  EAR, NOSE, THROAT: Clear without exudates. No external lesions.  NECK: Supple. No thyromegaly. No nodules. No JVD.  PULMONARY: Diminished breath sounds throughout all lung fields. Scant expiratory wheeze No use of accessory muscles, poor respiratory effort. Poor air entry bilaterally CHEST: Nontender to palpation.  CARDIOVASCULAR: S1 and S2. Irregular rate and rhythm. No murmurs, rubs, or gallops. No edema. Pedal pulses 2+ bilaterally.  GASTROINTESTINAL: Soft, nontender, nondistended. No masses. Positive bowel sounds. No hepatosplenomegaly.  MUSCULOSKELETAL: No  swelling, clubbing, or edema. Range of motion full in all extremities.  NEUROLOGIC: Cranial nerves II through XII are intact. No gross focal neurological deficits. Sensation intact. Reflexes intact.  SKIN: No ulceration, lesions, rashes, or cyanosis. Skin warm and dry. Turgor intact.  PSYCHIATRIC: Mood, affect within normal limits. The patient is awake, alert and oriented x 3. Insight, judgment intact.    LABORATORY PANEL:   CBC  Recent Labs Lab 08/18/14 1950  WBC 11.1*  HGB 11.4*  HCT 36.2  PLT 290   ------------------------------------------------------------------------------------------------------------------  Chemistries   Recent Labs Lab 08/13/14 1140  08/18/14 1950  NA 138  --  137  K 3.4*  --  2.7*  CL 100*  --  95*  CO2 29  --  32  GLUCOSE 119*  --  100*  BUN 6  --  <5*  CREATININE 0.60  < > 0.50  CALCIUM 8.2*  --  7.5*  AST 41  --   --   ALT 22  --   --   ALKPHOS 192*  --   --   BILITOT 0.6  --   --   < > =  values in this interval not displayed. ------------------------------------------------------------------------------------------------------------------  Cardiac Enzymes  Recent Labs Lab 08/18/14 1950  TROPONINI <0.03   ------------------------------------------------------------------------------------------------------------------  RADIOLOGY:  Dg Chest Port 1 View  08/18/2014   CLINICAL DATA:  Shortness of breath. Recently diagnosed with pneumonia.  EXAM: PORTABLE CHEST - 1 VIEW  COMPARISON:  08/13/2014 CT and plain film.  FINDINGS: Midline trachea. Mild cardiomegaly with transverse aortic atherosclerosis. No pleural effusion or pneumothorax. Hyperinflation. Right hilar soft tissue fullness is likely related to pulmonary artery enlargement, when correlated with recent CT. No lobar consolidation. Mild left base volume loss.  IMPRESSION: Hyperinflation/COPD, without acute superimposed process. Please note that the right lower lobe opacity on the prior  CT would likely not be plain film visible.  Aortic atherosclerosis.  Pulmonary artery enlargement suggests pulmonary arterial hypertension.   Electronically Signed   By: Abigail Miyamoto M.D.   On: 08/18/2014 20:28    EKG:   Orders placed or performed during the hospital encounter of 08/18/14  . ED EKG  . ED EKG    IMPRESSION AND PLAN:   81 year old Caucasian female history of COPD, paroxysmal A. fib presenting with shortness of breath.  1. COPD exacerbation: Supplemental O2 to keep SaO2 greater than 92%, DuoNeb treatments, azithromycin, Solu-Medrol 60 mg IV daily 2. Hypokalemia: Check magnesium level replace potassium: 4-5 3. Paroxysmal A. fib: Currently rate controlled 4. GERD without esophagitis: PPI therapy 5. Venous thromboembolism prophylactic: Heparin subcutaneous    All the records are reviewed and case discussed with ED provider. Management plans discussed with the patient, family and they are in agreement.  CODE STATUS: DO NOT INTUBATE  TOTAL TIME TAKING CARE OF THIS PATIENT: 35 minutes.    Hower,  Karenann Cai.D on 08/18/2014 at 11:37 PM  Between 7am to 6pm - Pager - [phone]  After 6pm: House Pager: - [phone]  [facility]  Office  [phone]  CC: Primary care physician; Tricia Callander, MD

## 2014-08-18 NOTE — ED Notes (Signed)
Patient here because she states she is not getting any better

## 2014-08-18 NOTE — ED Notes (Signed)
Patient treated here recently for pneumonia. Was sent to rehab. Daughter thinks patient needs to be re-evauated.

## 2014-08-18 NOTE — ED Notes (Signed)
Patient currently taking nebulizer. Daughter at bedside.

## 2014-08-19 LAB — TROPONIN I: Troponin I: <0.03 ng/mL (ref <0.031)

## 2014-08-19 LAB — POTASSIUM: Potassium: 3.5 mmol/L (ref 3.5–5.1)

## 2014-08-19 LAB — MAGNESIUM: Magnesium: 1.5 mg/dL — ABNORMAL LOW (ref 1.7–2.4)

## 2014-08-19 MED ORDER — PANTOPRAZOLE SODIUM 40 MG PO TBEC
40.0000 mg | DELAYED_RELEASE_TABLET | Freq: Every day | ORAL | Status: DC
  Administered 2014-08-20 – 2014-08-24 (×5): 40 mg via ORAL
  Filled 2014-08-19 (×5): qty 1

## 2014-08-19 MED ORDER — ALBUTEROL SULFATE (2.5 MG/3ML) 0.083% IN NEBU
2.5000 mg | INHALATION_SOLUTION | Freq: Four times a day (QID) | RESPIRATORY_TRACT | Status: DC | PRN
  Administered 2014-08-21: 2.5 mg via RESPIRATORY_TRACT
  Filled 2014-08-19: qty 3

## 2014-08-19 MED ORDER — FLUTICASONE PROPIONATE 50 MCG/ACT NA SUSP
2.0000 | Freq: Every day | NASAL | Status: DC
  Administered 2014-08-19 – 2014-08-24 (×6): 2 via NASAL
  Filled 2014-08-19: qty 16

## 2014-08-19 MED ORDER — SERTRALINE HCL 50 MG PO TABS
200.0000 mg | ORAL_TABLET | Freq: Every day | ORAL | Status: DC
  Administered 2014-08-19 – 2014-08-23 (×5): 200 mg via ORAL
  Filled 2014-08-19 (×5): qty 4

## 2014-08-19 MED ORDER — PRIMIDONE 50 MG PO TABS
50.0000 mg | ORAL_TABLET | Freq: Two times a day (BID) | ORAL | Status: DC
Start: 2014-08-19 — End: 2014-08-24
  Administered 2014-08-19 – 2014-08-24 (×9): 50 mg via ORAL
  Filled 2014-08-19 (×15): qty 1

## 2014-08-19 MED ORDER — MECLIZINE HCL 25 MG PO TABS
25.0000 mg | ORAL_TABLET | Freq: Three times a day (TID) | ORAL | Status: DC | PRN
  Administered 2014-08-19: 25 mg via ORAL
  Filled 2014-08-19: qty 1

## 2014-08-19 MED ORDER — MAGNESIUM SULFATE 2 GM/50ML IV SOLN
2.0000 g | Freq: Once | INTRAVENOUS | Status: AC
  Administered 2014-08-19: 2 g via INTRAVENOUS
  Filled 2014-08-19: qty 50

## 2014-08-19 MED ORDER — NYSTATIN 100000 UNIT/ML MT SUSP
5.0000 mL | Freq: Four times a day (QID) | OROMUCOSAL | Status: DC
  Administered 2014-08-19 – 2014-08-24 (×18): 500000 [IU] via ORAL
  Filled 2014-08-19 (×18): qty 5

## 2014-08-19 MED ORDER — METOPROLOL SUCCINATE ER 25 MG PO TB24
12.5000 mg | ORAL_TABLET | Freq: Every day | ORAL | Status: DC
  Administered 2014-08-22 – 2014-08-24 (×3): 12.5 mg via ORAL
  Filled 2014-08-19 (×5): qty 1

## 2014-08-19 MED ORDER — IPRATROPIUM-ALBUTEROL 0.5-2.5 (3) MG/3ML IN SOLN
3.0000 mL | Freq: Three times a day (TID) | RESPIRATORY_TRACT | Status: DC
  Administered 2014-08-20 – 2014-08-24 (×12): 3 mL via RESPIRATORY_TRACT
  Filled 2014-08-19 (×14): qty 3

## 2014-08-19 MED ORDER — SCOPOLAMINE 1 MG/3DAYS TD PT72
1.0000 | MEDICATED_PATCH | TRANSDERMAL | Status: DC
  Filled 2014-08-19 (×2): qty 1

## 2014-08-19 MED ORDER — MAGNESIUM OXIDE 400 (241.3 MG) MG PO TABS
400.0000 mg | ORAL_TABLET | Freq: Every day | ORAL | Status: DC
  Administered 2014-08-21 – 2014-08-24 (×4): 400 mg via ORAL
  Filled 2014-08-19 (×5): qty 1

## 2014-08-19 MED ORDER — METOCLOPRAMIDE HCL 5 MG/ML IJ SOLN
5.0000 mg | Freq: Three times a day (TID) | INTRAMUSCULAR | Status: DC
  Administered 2014-08-19 – 2014-08-20 (×4): 5 mg via INTRAVENOUS
  Administered 2014-08-20: 09:00:00 via INTRAVENOUS
  Filled 2014-08-19 (×5): qty 2

## 2014-08-19 MED ORDER — LEVOTHYROXINE SODIUM 25 MCG PO TABS
25.0000 ug | ORAL_TABLET | ORAL | Status: DC
  Administered 2014-08-20 – 2014-08-24 (×5): 25 ug via ORAL
  Filled 2014-08-19 (×5): qty 1

## 2014-08-19 MED ORDER — PROMETHAZINE HCL 25 MG/ML IJ SOLN
12.5000 mg | INTRAMUSCULAR | Status: DC | PRN
Start: 2014-08-19 — End: 2014-08-20
  Administered 2014-08-19 – 2014-08-20 (×4): 12.5 mg via INTRAVENOUS
  Filled 2014-08-19 (×6): qty 1

## 2014-08-19 NOTE — Progress Notes (Signed)
[name] NAME: Tricia Miller    MR#:  [id number]  DATE OF BIRTH:  July 27, 1929  SUBJECTIVE:  CHIEF COMPLAINT:   Chief Complaint  Patient presents with  . Weakness   SOB some better. Still has vertigo and nausea with change in position. REVIEW OF SYSTEMS:    Review of Systems  Constitutional: Positive for malaise/fatigue. Negative for fever and chills.  HENT: Negative for sore throat.   Eyes: Negative for blurred vision, double vision and pain.  Respiratory: Positive for cough, shortness of breath and wheezing. Negative for hemoptysis.   Cardiovascular: Negative for chest pain, palpitations, orthopnea and leg swelling.  Gastrointestinal: Positive for nausea and vomiting. Negative for heartburn, abdominal pain, diarrhea and constipation.  Genitourinary: Negative for dysuria and hematuria.  Musculoskeletal: Negative for back pain and joint pain.  Skin: Negative for rash.  Neurological: Positive for dizziness and weakness. Negative for sensory change, speech change, focal weakness and headaches.  Endo/Heme/Allergies: Does not bruise/bleed easily.  Psychiatric/Behavioral: Negative for depression. The patient is not nervous/anxious.       DRUG ALLERGIES:   Allergies  Allergen Reactions  . Bactrim [Sulfamethoxazole-Trimethoprim]   . Ciprofloxacin   . Codeine   . Felodipine   . Levaquin [Levofloxacin In D5w]   . Morphine And Related   . Penicillins   . Pregabalin   . Trimethoprim     VITALS:  Blood pressure 135/45, pulse 69, temperature 97.7 F (36.5 C), temperature source Oral, resp. rate 20, height 4\' 11"  (1.499 m), weight 88.587 kg (195 lb 4.8 oz), SpO2 94 %.  PHYSICAL EXAMINATION:   Physical Exam  GENERAL:  81 y.o.-year-old patient lying in the bed with no acute distress.  EYES: Pupils equal, round, reactive to light and accommodation. No scleral icterus. Extraocular muscles intact.  HEENT: Head  atraumatic, normocephalic. Oropharynx and nasopharynx clear.  NECK:  Supple, no jugular venous distention. No thyroid enlargement, no tenderness.  LUNGS: Bilateral weezing CARDIOVASCULAR: S1, S2 normal. No murmurs, rubs, or gallops.  ABDOMEN: Soft, nontender, nondistended. Bowel sounds present. No organomegaly or mass.  EXTREMITIES: No cyanosis, clubbing or edema b/l.    NEUROLOGIC: Cranial nerves II through XII are intact. No focal Motor or sensory deficits b/l.   PSYCHIATRIC: The patient is alert and oriented x 3.  SKIN: No obvious rash, lesion, or ulcer.    LABORATORY PANEL:   CBC  Recent Labs Lab 08/18/14 1950  WBC 11.1*  HGB 11.4*  HCT 36.2  PLT 290   ------------------------------------------------------------------------------------------------------------------  Chemistries   Recent Labs Lab 08/13/14 1140  08/18/14 1950 08/19/14 0705  NA 138  --  137  --   K 3.4*  --  2.7* 3.5  CL 100*  --  95*  --   CO2 29  --  32  --   GLUCOSE 119*  --  100*  --   BUN 6  --  <5*  --   CREATININE 0.60  < > 0.50  --   CALCIUM 8.2*  --  7.5*  --   MG  --   --   --  1.5*  AST 41  --   --   --   ALT 22  --   --   --   ALKPHOS 192*  --   --   --   BILITOT 0.6  --   --   --   < > = values in this interval not displayed. ------------------------------------------------------------------------------------------------------------------  Cardiac Enzymes  Recent Labs Lab 08/19/14 0705  TROPONINI <0.03   ------------------------------------------------------------------------------------------------------------------  RADIOLOGY:  Dg Chest Port 1 View  08/18/2014   CLINICAL DATA:  Shortness of breath. Recently diagnosed with pneumonia.  EXAM: PORTABLE CHEST - 1 VIEW  COMPARISON:  08/13/2014 CT and plain film.  FINDINGS: Midline trachea. Mild cardiomegaly with transverse aortic atherosclerosis. No pleural effusion or pneumothorax. Hyperinflation. Right hilar soft tissue fullness  is likely related to pulmonary artery enlargement, when correlated with recent CT. No lobar consolidation. Mild left base volume loss.  IMPRESSION: Hyperinflation/COPD, without acute superimposed process. Please note that the right lower lobe opacity on the prior CT would likely not be plain film visible.  Aortic atherosclerosis.  Pulmonary artery enlargement suggests pulmonary arterial hypertension.   Electronically Signed   By: Abigail Miyamoto M.D.   On: 08/18/2014 20:28     ASSESSMENT AND PLAN:   81 year old Caucasian female history of COPD, paroxysmal A. fib presenting with shortness of breath.  * COPD exacerbation -IV steroids, Antibiotics - Scheduled Nebulizers - Inhalers -Wean O2 as tolerated - Consult pulmonary if no improvement  * vertigo Start meclizine. Reglan  * Hypokalemia Replaced  * Hypomagnesemia - replace thru IV  * Paroxysmal A. fib: Currently rate controlled Continue meds   All the records are reviewed and case discussed with Care Management/Social Workerr. Management plans discussed with the patient, family and they are in agreement.  DVT Prophylaxis: SCDs  TOTAL TIME TAKING CARE OF THIS PATIENT: 35 minutes.   POSSIBLE D/C IN 1-2 DAYS, DEPENDING ON CLINICAL CONDITION.   [name] R M.D on 08/19/2014 at 11:40 AM  Between 7am to 6pm - Pager - [phone]  After 6pm go to [email or web address] - password [email or web address] [facility]  Office  [phone]  CC: Primary care physician; Kingsley Callander, MD

## 2014-08-19 NOTE — Plan of Care (Signed)
Problem: Discharge Progression Outcomes Goal: Other Discharge Outcomes/Goals Outcome: Progressing Plan of care progress to goals: Discharge plan-pt is from [facility], plan [facility]. Pain- pt c/o generalized pain all over, improved with prn pain medication. Hemo dynamically stable- potassium level low and pt unable to tolerate PO d/t nausea unrelieved with zofran (ED) and phenergan after arriving to room 108. 1 Liter NS with KCL added infused, then IVF changes to NS at 140ml/hr. IV antibiotics infused per MD order. Complications- pts daughter at bedside states that pt does not use scolpolimine patches anymore because they were not helping and causing redness behind pts ears. Daughter states that she now takes Vilas.  Diet- c/o nausea with no vomit, little improvement noted after phenergan. Activity- pt has had decreased mobility since the end of June, possible PT consult

## 2014-08-19 NOTE — Plan of Care (Signed)
Problem: Discharge Progression Outcomes Goal: Other Discharge Outcomes/Goals Outcome: Progressing Plan of Care Progress to Goal:  Pt c/o nausea and dizziness throughout the day.  Relieved w/phenergen, reglan and meclizine.  Pt slept.  PO intake very poor. Refused PO meds.  IVF stopped. Also c/o of mouth and throat pain.  Got order for nystatin - thrush not obvious.  Goddaughter sd that pt had pituitary tumor 2.5 yrs ago and had brain surgery.  Has suffered from vertigo since. Pt seems  Very depressed.

## 2014-08-19 NOTE — Progress Notes (Signed)
Dr. Lavetta Nielsen notified of pts c/o nausea. Pt currently ordered Zofran and states that it does not work. Pt is requesting Phenergan IV 12.5mg . New order entered by Dr. Lavetta Nielsen.

## 2014-08-20 LAB — BASIC METABOLIC PANEL
Anion gap: 7 (ref 5–15)
BUN: 11 mg/dL (ref 6–20)
CHLORIDE: 95 mmol/L — AB (ref 101–111)
CO2: 34 mmol/L — AB (ref 22–32)
Calcium: 8 mg/dL — ABNORMAL LOW (ref 8.9–10.3)
Creatinine, Ser: 0.6 mg/dL (ref 0.44–1.00)
GFR calc non Af Amer: >60 mL/min (ref >60)
GLUCOSE: 111 mg/dL — AB (ref 65–99)
Potassium: 2.8 mmol/L — CL (ref 3.5–5.1)
SODIUM: 136 mmol/L (ref 135–145)

## 2014-08-20 MED ORDER — ONDANSETRON HCL 4 MG/2ML IJ SOLN
4.0000 mg | Freq: Four times a day (QID) | INTRAMUSCULAR | Status: DC | PRN
  Administered 2014-08-20 – 2014-08-24 (×5): 4 mg via INTRAVENOUS
  Filled 2014-08-20 (×5): qty 2

## 2014-08-20 MED ORDER — POTASSIUM CHLORIDE CRYS ER 20 MEQ PO TBCR
40.0000 meq | EXTENDED_RELEASE_TABLET | ORAL | Status: AC
  Administered 2014-08-20 (×2): 40 meq via ORAL
  Filled 2014-08-20 (×2): qty 2

## 2014-08-20 MED ORDER — POTASSIUM CHLORIDE 10 MEQ/100ML IV SOLN
10.0000 meq | INTRAVENOUS | Status: DC
  Filled 2014-08-20 (×3): qty 100

## 2014-08-20 MED ORDER — MECLIZINE HCL 25 MG PO TABS
25.0000 mg | ORAL_TABLET | Freq: Three times a day (TID) | ORAL | Status: DC
  Administered 2014-08-20 – 2014-08-24 (×13): 25 mg via ORAL
  Filled 2014-08-20 (×14): qty 1

## 2014-08-20 MED ORDER — PROMETHAZINE HCL 25 MG RE SUPP
25.0000 mg | Freq: Four times a day (QID) | RECTAL | Status: DC | PRN
  Administered 2014-08-21 – 2014-08-23 (×4): 25 mg via RECTAL
  Filled 2014-08-20 (×4): qty 1

## 2014-08-20 NOTE — Progress Notes (Signed)
[facility] at [place] NAME: Tricia Miller    MR#:  [id number]  DATE OF BIRTH:  [birth year]  SUBJECTIVE:  CHIEF COMPLAINT:   Chief Complaint  Patient presents with  . Weakness   Feels "Terrible" SOB. Nausea. Generalized pain.   REVIEW OF SYSTEMS:    Review of Systems  Constitutional: Positive for malaise/fatigue. Negative for fever and chills.  HENT: Negative for sore throat.   Eyes: Negative for blurred vision, double vision and pain.  Respiratory: Positive for cough, shortness of breath and wheezing. Negative for hemoptysis.   Cardiovascular: Negative for chest pain, palpitations, orthopnea and leg swelling.  Gastrointestinal: Positive for nausea and vomiting. Negative for heartburn, abdominal pain, diarrhea and constipation.  Genitourinary: Negative for dysuria and hematuria.  Musculoskeletal: Negative for back pain and joint pain.  Skin: Negative for rash.  Neurological: Positive for dizziness and weakness. Negative for sensory change, speech change, focal weakness and headaches.  Endo/Heme/Allergies: Does not bruise/bleed easily.  Psychiatric/Behavioral: Negative for depression. The patient is not nervous/anxious.       DRUG ALLERGIES:   Allergies  Allergen Reactions  . Bactrim [Sulfamethoxazole-Trimethoprim]   . Ciprofloxacin   . Codeine   . Felodipine   . Levaquin [Levofloxacin In D5w]   . Morphine And Related   . Penicillins   . Pregabalin   . Trimethoprim     VITALS:  Blood pressure 124/52, pulse 78, temperature 98.1 F (36.7 C), temperature source Oral, resp. rate 17, height 4\' 11"  (1.499 m), weight 88.587 kg (195 lb 4.8 oz), SpO2 95 %.  PHYSICAL EXAMINATION:   Physical Exam  GENERAL:  81 y.o.-year-old patient lying in the bed with no acute distress.  EYES: Pupils equal, round, reactive to light and accommodation. No scleral icterus. Extraocular muscles intact.  HEENT: Head atraumatic,  normocephalic. Oropharynx and nasopharynx clear.  NECK:  Supple, no jugular venous distention. No thyroid enlargement, no tenderness.  LUNGS: Bilateral weezing CARDIOVASCULAR: S1, S2 normal. No murmurs, rubs, or gallops.  ABDOMEN: Soft, nontender, nondistended. Bowel sounds present. No organomegaly or mass.  EXTREMITIES: No cyanosis, clubbing or edema b/l.    NEUROLOGIC: Cranial nerves II through XII are intact. No focal Motor or sensory deficits b/l.   PSYCHIATRIC: The patient is alert and oriented x 3.  SKIN: No obvious rash, lesion, or ulcer.    LABORATORY PANEL:   CBC  Recent Labs Lab 08/18/14 1950  WBC 11.1*  HGB 11.4*  HCT 36.2  PLT 290   ------------------------------------------------------------------------------------------------------------------  Chemistries   Recent Labs Lab 08/13/14 1140  08/19/14 0705 08/20/14 0550  NA 138  < >  --  136  K 3.4*  < > 3.5 2.8*  CL 100*  < >  --  95*  CO2 29  < >  --  34*  GLUCOSE 119*  < >  --  111*  BUN 6  < >  --  11  CREATININE 0.60  < >  --  0.60  CALCIUM 8.2*  < >  --  8.0*  MG  --   --  1.5*  --   AST 41  --   --   --   ALT 22  --   --   --   ALKPHOS 192*  --   --   --   BILITOT 0.6  --   --   --   < > = values in this interval not displayed. ------------------------------------------------------------------------------------------------------------------  Cardiac Enzymes  Recent Labs Lab 08/19/14 0705  TROPONINI <0.03   ------------------------------------------------------------------------------------------------------------------  RADIOLOGY:  Dg Chest Port 1 View  08/18/2014   CLINICAL DATA:  Shortness of breath. Recently diagnosed with pneumonia.  EXAM: PORTABLE CHEST - 1 VIEW  COMPARISON:  08/13/2014 CT and plain film.  FINDINGS: Midline trachea. Mild cardiomegaly with transverse aortic atherosclerosis. No pleural effusion or pneumothorax. Hyperinflation. Right hilar soft tissue fullness is likely  related to pulmonary artery enlargement, when correlated with recent CT. No lobar consolidation. Mild left base volume loss.  IMPRESSION: Hyperinflation/COPD, without acute superimposed process. Please note that the right lower lobe opacity on the prior CT would likely not be plain film visible.  Aortic atherosclerosis.  Pulmonary artery enlargement suggests pulmonary arterial hypertension.   Electronically Signed   By: Abigail Miyamoto M.D.   On: 08/18/2014 20:28     ASSESSMENT AND PLAN:   81 year old Caucasian female history of COPD, paroxysmal A. fib presenting with shortness of breath.  * COPD exacerbation -IV steroids, Antibiotics - Scheduled Nebulizers - Inhalers -Wean O2 as tolerated - Consult pulmonary if no improvement  * vertigo Started meclizine. Make it scheduled Reglan TID AC  * Hypokalemia Replace thru IV and PO  * Hypomagnesemia - replace thru IV  * Paroxysmal A. fib: Currently rate controlled Continue meds  All the records are reviewed and case discussed with Care Management/Social Workerr. Management plans discussed with the patient, family and they are in agreement.  DVT Prophylaxis: SCDs  TOTAL TIME TAKING CARE OF THIS PATIENT: 35 minutes.   POSSIBLE D/C IN 1-2 DAYS, DEPENDING ON CLINICAL CONDITION.   [name] R M.D on 08/20/2014 at 11:28 AM  Between 7am to 6pm - Pager - [phone]  After 6pm go to [email or web address] - password [email or web address] [facility]  Office  [phone]  CC: Primary care physician; Kingsley Callander, MD

## 2014-08-20 NOTE — Plan of Care (Signed)
Problem: Discharge Progression Outcomes Goal: Other Discharge Outcomes/Goals Outcome: Progressing Plan of care progress to goals: Discharge plan-pt is from [facility], [facility]. Pain- pt c/o generalized pain all over, improved with prn pain medication. Hemo dynamically stable- WBC's 11.1, IV antibx infused per MD order. VSS. Diet- c/o nausea with no vomit, little improvement noted after PRN phenergan. Activity- pt has had decreased mobility since the end of June, possible PT consult

## 2014-08-20 NOTE — Plan of Care (Signed)
Problem: Discharge Progression Outcomes Goal: Other Discharge Outcomes/Goals Plan of Care Progress to Goal:  Pt continues on oxygen by Nasal Cannula, pt given morphine x 1 for c/o knee pain, pt unable to take IV KCL supplement, but tolerated PO supplement well.

## 2014-08-20 NOTE — Progress Notes (Signed)
Pt refusing for IV potassium to run as it is burning, unable to get another IV site at this time, Dr. Darvin Neighbours notified, IV potassium d/c'd pt tolerated PO potassium supplement

## 2014-08-20 NOTE — Care Management (Signed)
IM given, A resident of [facility] short term side. May return to [facility] when stable per Darden Dates, Clinical Social Worker Shelbie Ammons RN MSN Care Management [phone]

## 2014-08-21 ENCOUNTER — Inpatient Hospital Stay: Payer: Medicare Other

## 2014-08-21 LAB — BASIC METABOLIC PANEL
Anion gap: 6 (ref 5–15)
BUN: 11 mg/dL (ref 6–20)
CHLORIDE: 98 mmol/L — AB (ref 101–111)
CO2: 31 mmol/L (ref 22–32)
Calcium: 7.9 mg/dL — ABNORMAL LOW (ref 8.9–10.3)
Creatinine, Ser: 0.49 mg/dL (ref 0.44–1.00)
GLUCOSE: 87 mg/dL (ref 65–99)
Potassium: 3.4 mmol/L — ABNORMAL LOW (ref 3.5–5.1)
SODIUM: 135 mmol/L (ref 135–145)

## 2014-08-21 MED ORDER — POTASSIUM CHLORIDE CRYS ER 20 MEQ PO TBCR: 40.0000 meq | EXTENDED_RELEASE_TABLET | ORAL | Status: DC | PRN

## 2014-08-21 MED ORDER — POTASSIUM CHLORIDE CRYS ER 20 MEQ PO TBCR
40.0000 meq | EXTENDED_RELEASE_TABLET | Freq: Every day | ORAL | Status: AC
Start: 2014-08-22 — End: 2014-08-23
  Administered 2014-08-22 – 2014-08-23 (×2): 40 meq via ORAL
  Filled 2014-08-21 (×2): qty 2

## 2014-08-21 MED ORDER — KETOROLAC TROMETHAMINE 15 MG/ML IJ SOLN
15.0000 mg | Freq: Four times a day (QID) | INTRAMUSCULAR | Status: DC | PRN
  Administered 2014-08-21 – 2014-08-24 (×8): 15 mg via INTRAVENOUS
  Filled 2014-08-21 (×8): qty 1

## 2014-08-21 MED ORDER — BISACODYL 5 MG PO TBEC
10.0000 mg | DELAYED_RELEASE_TABLET | Freq: Every day | ORAL | Status: DC
  Administered 2014-08-21 – 2014-08-23 (×3): 10 mg via ORAL
  Filled 2014-08-21 (×4): qty 2

## 2014-08-21 MED ORDER — SODIUM CHLORIDE 0.9 % IV BOLUS (SEPSIS)
250.0000 mL | Freq: Once | INTRAVENOUS | Status: AC
  Administered 2014-08-21: 11:00:00 250 mL via INTRAVENOUS

## 2014-08-21 MED ORDER — POLYETHYLENE GLYCOL 3350 17 G PO PACK
17.0000 g | PACK | Freq: Every day | ORAL | Status: DC | PRN
  Administered 2014-08-22: 11:00:00 17 g via ORAL
  Filled 2014-08-21: qty 1

## 2014-08-21 MED ORDER — OXYCODONE HCL 5 MG PO TABS
5.0000 mg | ORAL_TABLET | Freq: Four times a day (QID) | ORAL | Status: DC | PRN
  Administered 2014-08-21: 5 mg via ORAL
  Filled 2014-08-21: qty 1

## 2014-08-21 MED ORDER — AZITHROMYCIN 250 MG PO TABS
250.0000 mg | ORAL_TABLET | Freq: Every day | ORAL | Status: AC
  Administered 2014-08-21 – 2014-08-23 (×3): 250 mg via ORAL
  Filled 2014-08-21 (×3): qty 1

## 2014-08-21 NOTE — Clinical Social Work Note (Signed)
Clinical Social Work Assessment  Patient Details  Name: Tricia Miller MRN: [id number] Date of Birth: Sep 02, 1929  Date of referral:  08/21/14               Reason for consult:  Facility Placement                Permission sought to share information with:  Family Supports Permission granted to share information::  Yes, Verbal Permission Granted  Name::      (Pt's daughter, [name])   Housing/Transportation Living arrangements for the past 2 months:  Reserve of Information:  Adult Children Patient Interpreter Needed:  None Criminal Activity/Legal Involvement Pertinent to Current Situation/Hospitalization:  No - Comment as needed Significant Relationships:  Adult Children Lives with:  Facility Resident Do you feel safe going back to the place where you live?  Yes Need for family participation in patient care:  Yes (Comment)  Care giving concerns:  None reported.    Social Worker assessment / plan:  CSW met with family as pt was sleeping soundly to address consult. CSW introduced herself and explained role of social work. CSW also explained the process of returning to SNF. Pt was admitted from [facility]. Pt's daughter would like for pt to return, however pt would like to return home. Pt's daughters are looking into taking pt home. CSW will update RNCM, however CSW will continue to follow should discharge plan change.   Pt is able to return to [facility] as CSW confirmed with facility. FL2 is on the chart. CSW will continue to follow.   Employment status:  Retired Nurse, adult PT Recommendations:  Not assessed at this time Information / Referral to community resources:  [place]  Patient/Family's Response to care:  Pt's daughter was very pleased with CSW support.   Patient/Family's Understanding of and Emotional Response to Diagnosis, Current Treatment, and Prognosis:  Pt's daughters understand that pt  would benefit from returning to [facility], however they also would like to accommodate pt's wishes.   Emotional Assessment Appearance:  Appears stated age Attitude/Demeanor/Rapport:  Other (sleeping soundly) Affect (typically observed):  Other (sleeping soundly) Orientation:  Oriented to Self, Oriented to Place, Oriented to  Time, Oriented to Situation Alcohol / Substance use:  Never Used Psych involvement (Current and /or in the community):  No (Comment)  Discharge Needs  Concerns to be addressed:  No discharge needs identified Readmission within the last 30 days:  Yes (was admitted to [facility]) Current discharge risk:  None Barriers to Discharge:  No Barriers Identified   Darden Dates, LCSW 08/21/2014, 3:52 PM

## 2014-08-21 NOTE — Plan of Care (Signed)
Problem: Discharge Progression Outcomes Goal: Other Discharge Outcomes/Goals Outcome: Progressing Plan of care progress to goals: O2 sats in the high 90's on 3.5L O2 per [place]. Wheezes, congested. Afebrile. BP/HR stable. Zofran given for nausea with improvement. Dizziness with movement in the bed, continue meclizine. Morphine given for headache and LLQ with improvement.

## 2014-08-21 NOTE — Progress Notes (Signed)
[place] [facility] [place] NAME: Tricia Miller    MR#:  [id number]  DATE OF BIRTH:  06/24/1929  SUBJECTIVE:  CHIEF COMPLAINT:   Chief Complaint  Patient presents with  . Weakness   SOB some improvement. Dizzy, nausea. Constipation.   REVIEW OF SYSTEMS:    Review of Systems  Constitutional: Positive for malaise/fatigue. Negative for fever and chills.  HENT: Negative for sore throat.   Eyes: Negative for blurred vision, double vision and pain.  Respiratory: Positive for cough, shortness of breath and wheezing. Negative for hemoptysis.   Cardiovascular: Negative for chest pain, palpitations, orthopnea and leg swelling.  Gastrointestinal: Positive for nausea and vomiting. Negative for heartburn, abdominal pain, diarrhea and constipation.  Genitourinary: Negative for dysuria and hematuria.  Musculoskeletal: Negative for back pain and joint pain.  Skin: Negative for rash.  Neurological: Positive for dizziness and weakness. Negative for sensory change, speech change, focal weakness and headaches.  Endo/Heme/Allergies: Does not bruise/bleed easily.  Psychiatric/Behavioral: Negative for depression. The patient is not nervous/anxious.       DRUG ALLERGIES:   Allergies  Allergen Reactions  . Bactrim [Sulfamethoxazole-Trimethoprim]   . Ciprofloxacin   . Codeine   . Felodipine   . Levaquin [Levofloxacin In D5w]   . Morphine And Related   . Penicillins   . Pregabalin   . Trimethoprim     VITALS:  Blood pressure 133/59, pulse 90, temperature 98.6 F (37 C), temperature source Oral, resp. rate 20, height 4\' 11"  (1.499 m), weight 88.587 kg (195 lb 4.8 oz), SpO2 96 %.  PHYSICAL EXAMINATION:   Physical Exam  GENERAL:  81 y.o.-year-old patient lying in the bed with no acute distress.  EYES: Pupils equal, round, reactive to light and accommodation. No scleral icterus. Extraocular muscles intact.  HEENT: Head atraumatic,  normocephalic. Oropharynx and nasopharynx clear.  NECK:  Supple, no jugular venous distention. No thyroid enlargement, no tenderness.  LUNGS: Bilateral weezing CARDIOVASCULAR: S1, S2 normal. No murmurs, rubs, or gallops.  ABDOMEN: Soft, nontender, nondistended. Bowel sounds present. No organomegaly or mass.  EXTREMITIES: No cyanosis, clubbing or edema b/l.    NEUROLOGIC: Cranial nerves II through XII are intact. No focal Motor or sensory deficits b/l.   PSYCHIATRIC: The patient is alert and oriented x 3.  SKIN: No obvious rash, lesion, or ulcer.    LABORATORY PANEL:   CBC  Recent Labs Lab 08/18/14 1950  WBC 11.1*  HGB 11.4*  HCT 36.2  PLT 290   ------------------------------------------------------------------------------------------------------------------  Chemistries   Recent Labs Lab 08/19/14 0705  08/21/14 0447  NA  --   < > 135  K 3.5  < > 3.4*  CL  --   < > 98*  CO2  --   < > 31  GLUCOSE  --   < > 87  BUN  --   < > 11  CREATININE  --   < > 0.49  CALCIUM  --   < > 7.9*  MG 1.5*  --   --   < > = values in this interval not displayed. ------------------------------------------------------------------------------------------------------------------  Cardiac Enzymes  Recent Labs Lab 08/19/14 0705  TROPONINI <0.03   ------------------------------------------------------------------------------------------------------------------  RADIOLOGY:  No results found.   ASSESSMENT AND PLAN:   81 year old Caucasian female history of COPD, paroxysmal A. fib presenting with shortness of breath.  * COPD exacerbation -IV steroids, Antibiotics - Scheduled Nebulizers - Inhalers -Wean O2 as tolerated  * Vertigo Started meclizine. Made  it scheduled. Reglan TID AC  * Hypokalemia Scheduled daily dose.  * Hypomagnesemia - replace thru IV  * Paroxysmal A. fib: Currently rate controlled Continue meds  Check MRI brain and repeat CXR.  Likely d/c AM.  All the  records are reviewed and case discussed with Care Management/Social Workerr. Management plans discussed with the patient, family and they are in agreement.  DVT Prophylaxis: SCDs  TOTAL TIME TAKING CARE OF THIS PATIENT: 35 minutes.    [name] R M.D on 08/21/2014 at 1:25 PM  Between 7am to 6pm - Pager - [phone]  After 6pm go to [email or web address] - password [email or web address] [place] [facility]  Office  [phone]  CC: Primary care physician; Kingsley Callander, MD

## 2014-08-21 NOTE — Progress Notes (Signed)
PT Cancellation Note  Patient Details Name: Ajani Schnieders MRN: [id number] DOB: 1930/01/12   Cancelled Treatment:    Reason Eval/Treat Not Completed: Other (comment) (Nursing recommending holding PT at this time d/t results of MRI (MD's not yet aware of MRI results).)  Will re-attempt PT eval at a later date/time as able/as appropriate.   Raquel Sarna Suhail Peloquin 08/21/2014, 3:27 PM Leitha Bleak, [place]

## 2014-08-21 NOTE — Plan of Care (Signed)
Problem: Discharge Progression Outcomes Goal: Other Discharge Outcomes/Goals Outcome: Not Progressing Plan of Care Progress to Goal:  Pt still c/o of nausea and dizziness.  Nausea can be relieved w/phenergen.  Also c/o of generalized pain but particularly abd pain, left foot arch/ankle pain - relieved by Toradol.  PO intake very poor.  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  She continues to regurgitate after eating.  Will ask dr if she needs GI consult tomorrow for Barrett's esophagus. Care Mgr recommended palliative consult.  Got a laxative on board - hasn't had BM since 7.2.  Chest Xray showed mild cardiomegaly and small bilateral effusion w/assoc dependent atelectasis.  Was supposed to work w/PT today - they weren't sure if they should based on MRI results.  Will let dr reevaluate.

## 2014-08-21 NOTE — Care Management (Signed)
Important Message  Patient Details  Name: Journi Moffa MRN: [id number] Date of Birth: 1929/03/10   Medicare Important Message Given:  Yes-second notification given    Juliann Pulse A Allmond 08/21/2014, 10:08 AM

## 2014-08-22 ENCOUNTER — Inpatient Hospital Stay: Payer: Medicare Other

## 2014-08-22 ENCOUNTER — Encounter: Payer: Self-pay | Admitting: Urgent Care

## 2014-08-22 LAB — PLATELET COUNT: Platelets: 217 10*3/uL (ref 150–440)

## 2014-08-22 NOTE — Plan of Care (Signed)
Problem: Discharge Progression Outcomes Goal: Other Discharge Outcomes/Goals Outcome: Progressing Plan of Care Progress to Goal:  Pt had a barium swallow test today.  High  Grade distal esophageal stricture.  On dysphagia diet.  EGD planned w/esophageal dilation once resp status improves.  Also Palliative consult called. She refused to work w/PT today. BP stable today.  Was more insistent that pt take her meds today even though she didn't want to.

## 2014-08-22 NOTE — Consult Note (Signed)
Gastroenterology Consultation  Referring Provider:  Dr Darvin Neighbours Primary Care Physician:  Kingsley Callander, MD Primary Gastroenterologist:  n/a   Reason for Consultation:     Dysphagia  Date of Admission:  08/18/2014 Date of Consultation:  08/22/2014        HPI:   Tricia Miller is a 81 y.o. female admitted with COPD exacerbation & chronic respiratory failure & electrolyte imbalance.  She tells me for the past several months she has had coughing episodes when she tries to eat. She feels her food "dragged down" her esophagus. Feels like it gets stuck about midway and she regurgitates. She's having pounds with both solids and liquids. Symptoms started after she had intubation for surgery for pituitary tumor. She denies history of esophageal dilation. She does have upper abdominal pain and bloating. Discomfort is 6/10 all the time. She has had a "sore mouth" and believes this is secondary to her inhalers. She takes omeprazole 40 mg at home. This seems to control her heartburn well. Her weight has been stable. History of Barrett's esophagus.  She has dentures but has not been wearing them. Barium study shows a high-grade distal esophageal stricture although small amount of barium did pass through this area.  Past Medical History  Diagnosis Date  . COPD (chronic obstructive pulmonary disease)   . Vertigo   . Barrett esophagus   . Lymphoma of lymph nodes   . A-fib   . Pituitary macroadenoma     Past Surgical History  Procedure Laterality Date  . Abdominal hysterectomy    . Pituitary surgery    . Orthopedic surgery      Prior to Admission medications   Medication Sig Start Date End Date Taking? Authorizing Provider  albuterol (PROVENTIL HFA;VENTOLIN HFA) 108 (90 BASE) MCG/ACT inhaler Inhale 2 puffs into the lungs every 4 (four) hours as needed for wheezing or shortness of breath.    Historical Provider, MD  albuterol (PROVENTIL) (2.5 MG/3ML) 0.083% nebulizer solution Take 2.5 mg by  nebulization every 4 (four) hours as needed for wheezing or shortness of breath.    Historical Provider, MD  diclofenac sodium (VOLTAREN) 1 % GEL Apply 2 g topically 4 (four) times daily.    Historical Provider, MD  fluticasone (FLONASE) 50 MCG/ACT nasal spray Place 2 sprays into both nostrils daily.    Historical Provider, MD  furosemide (LASIX) 20 MG tablet Take 20 mg by mouth daily as needed for edema.    Historical Provider, MD  ibuprofen (ADVIL,MOTRIN) 800 MG tablet Take 800 mg by mouth every 8 (eight) hours as needed for mild pain.    Historical Provider, MD  ipratropium (ATROVENT HFA) 17 MCG/ACT inhaler Inhale 2 puffs into the lungs every 6 (six) hours.    Historical Provider, MD  levothyroxine (SYNTHROID, LEVOTHROID) 25 MCG tablet Take 25 mcg by mouth every morning.    Historical Provider, MD  magnesium oxide (MAG-OX) 400 MG tablet Take 400 mg by mouth daily.    Historical Provider, MD  metoprolol succinate (TOPROL-XL) 25 MG 24 hr tablet Take 12.5 mg by mouth daily.    Historical Provider, MD  omeprazole (PRILOSEC) 40 MG capsule Take 40 mg by mouth every morning.    Historical Provider, MD  primidone (MYSOLINE) 50 MG tablet Take 50 mg by mouth 2 (two) times daily.    Historical Provider, MD  promethazine (PHENERGAN) 25 MG tablet Take 25 mg by mouth every 6 (six) hours as needed for nausea or vomiting.  Historical Provider, MD  scopolamine (TRANSDERM-SCOP) 1 MG/3DAYS Place 1 patch onto the skin every 3 (three) days.    Historical Provider, MD  sertraline (ZOLOFT) 100 MG tablet Take 200 mg by mouth daily.    Historical Provider, MD  triamcinolone (KENALOG) 0.025 % cream Apply 1 application topically 2 (two) times daily.    Historical Provider, MD    Family History  Problem Relation Age of Onset  . Diabetes Neg Hx      History   Social History Narrative   History  Substance Use Topics  . Smoking status: Former Research scientist (life sciences)  . Smokeless tobacco: Not on file  . Alcohol Use: No     Allergies as of 08/18/2014 - Review Complete 08/18/2014  Allergen Reaction Noted  . Bactrim [sulfamethoxazole-trimethoprim]  08/13/2014  . Ciprofloxacin  08/13/2014  . Codeine  08/13/2014  . Felodipine  08/13/2014  . Levaquin [levofloxacin in d5w]  08/13/2014  . Morphine and related  08/13/2014  . Penicillins  08/13/2014  . Pregabalin  08/13/2014  . Trimethoprim  08/13/2014    Review of Systems:    All systems reviewed and negative except where noted in HPI.   Physical Exam:  Vital signs in last 24 hours: Temp:  [97.8 F (36.6 C)-98.5 F (36.9 C)] 98 F (36.7 C) (07/06 1333) Pulse Rate:  [76-123] 79 (07/06 1333) Resp:  [18-24] 20 (07/06 1333) BP: (114-138)/(51-90) 114/51 mmHg (07/06 1333) SpO2:  [96 %-100 %] 99 % (07/06 1333) Last BM Date: 08/18/14 Body mass index is 39.42 kg/(m^2). General:   Alert,  Well-developed. Elderly, obese, pleasant and cooperative in NAD Head:  Normocephalic and atraumatic. Eyes:  Sclera clear, no icterus.   Conjunctiva pink. Ears:  Normal auditory acuity. Nose:  No deformity, discharge, or lesions. Mouth:  No deformity or lesions,oropharynx pink & moist. Neck:  Supple; no masses or thyromegaly. Lungs:  Respirations even and unlabored.  Clear throughout to auscultation.   No wheezes, crackles, or rhonchi. No acute distress. Heart:  Regular rate and rhythm; no murmurs, clicks, rubs, or gallops. Abdomen:  Protuberant. Normal bowel sounds.  No bruits.  Soft, non-tender and non-distended without masses, hepatosplenomegaly or hernias noted.  No guarding or rebound tenderness.  Negative Carnett sign.   Rectal:  Deferred.  Msk:  Symmetrical without gross deformities.  Good, equal movement & strength bilaterally. Pulses:  Normal pulses noted. Extremities:  No clubbing or edema.  No cyanosis. Neurologic:  Alert and oriented x3;  grossly normal neurologically. Skin:  Intact. Multiple ecchymoses. No rash.  No jaundice. Lymph Nodes:  No significant  cervical adenopathy. Psych:  Alert and cooperative. Normal mood and affect.  LAB RESULTS:  Recent Labs  08/22/14 0444  PLT 217   CBC    Component Value Date/Time   WBC 11.1* 08/18/2014 1950   RBC 4.43 08/18/2014 1950   HGB 11.4* 08/18/2014 1950   HCT 36.2 08/18/2014 1950   PLT 217 08/22/2014 0444   MCV 81.6 08/18/2014 1950   MCH 25.6* 08/18/2014 1950   MCHC 31.4* 08/18/2014 1950   RDW 19.3* 08/18/2014 1950   LYMPHSABS 1.9 08/18/2014 1950   MONOABS 0.9 08/18/2014 1950   EOSABS 0.2 08/18/2014 1950   BASOSABS 0.1 08/18/2014 1950     BMET  Recent Labs  08/20/14 0550 08/21/14 0447  NA 136 135  K 2.8* 3.4*  CL 95* 98*  CO2 34* 31  GLUCOSE 111* 87  BUN 11 11  CREATININE 0.60 0.49  CALCIUM 8.0* 7.9*   STUDIES:  Dg Chest 2 View  08/21/2014   CLINICAL DATA:  Cough.  Shortness of breath.  Wheezing.  EXAM: CHEST  2 VIEW  COMPARISON:  08/18/2014  FINDINGS: Cardiac silhouette is mildly enlarged. No convincing mediastinal or hilar masses or adenopathy. There are small bilateral pleural effusions with associated dependent atelectasis. Lungs are hyperexpanded. No convincing pneumonia or pulmonary edema. No pneumothorax.  Bony thorax is demineralized but grossly intact.  IMPRESSION: 1. Mild cardiomegaly and small bilateral effusions with associated dependent atelectasis. No convincing pneumonia or pulmonary edema.   Electronically Signed   By: Lajean Manes M.D.   On: 08/21/2014 14:33   Mr Brain Wo Contrast  08/21/2014   CLINICAL DATA:  Vertigo. History of pituitary macro adenoma surgery. History of lymphoma.  EXAM: MRI HEAD WITHOUT CONTRAST  TECHNIQUE: Multiplanar, multiecho pulse sequences of the brain and surrounding structures were obtained without intravenous contrast.  COMPARISON:  None.  FINDINGS: Mass in the sella and suprasellar cistern measures approximately 22 x 12 mm. This has T1 shortening centrally. This may be a cystic lesion with a fluid level noted on axial T2 imaging.  There is compression of the optic chiasm due to mass-effect. Intravenous access was unsuccessful for contrast infusion. In addition, there is a well-defined defect in the clivus on the left which may be a surgical defect or related to tumor extension into the skullbase. This area measures approximately 10 x 15 mm. Both of these areas show hyperintensity on diffusion-weighted imaging. No invasion of the cavernous sinus identified.  Negative for acute infarct. Mild to moderate chronic microvascular ischemic changes in the white matter bilaterally. Negative for cortical infarct.  Ventricle size normal. Mild atrophy. Prior resection of the nasal septum. Mild mucosal edema in the paranasal sinuses. Mucosal edema in the mastoid sinus bilaterally right greater than left.  IMPRESSION: 22 x 12 mm mass in the sella and suprasellar cistern with compression of the optic chiasm. This may contain blood products with a fluid level. 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 Defect in the clivus on the left may represent tumor extension or a postsurgical defect.  The patient did not receive intravenous contrast due to lack of IV access. If IV access is obtained, MRI could be repeated with contrast. High-resolution dedicated pituitary protocol should be performed if repeat imaging with IV contrast is possible. Comparison prior MRI would be helpful to evaluate for interval change.   Electronically Signed   By: Franchot Gallo M.D.   On: 08/21/2014 14:42   Dg Esophagus  08/22/2014   CLINICAL DATA:  Dysphagia to solids some liquids  EXAM: ESOPHOGRAM/BARIUM SWALLOW  TECHNIQUE: Single contrast examination was performed using thin barium. The study was limited to having the patient in the supine and semi upright positions due to her clinical status.  FLUOROSCOPY TIME:  Fluoroscopy Time:  1 minutes, 30 seconds  Number of Acquired Images:   19  COMPARISON:  Chest x-ray of August 21, 2014  FINDINGS: The patient ingested the thin barium without difficulty. No laryngeal penetration of the barium was observed. The cervical and proximal and mid esophageal function was normal. However, at the level of the distal esophagus the barium did not immediately passed. With moving the patient in the semi upright position a small amount passed into the stomach. Normal distension of the distal esophagus and GE junction was never observed. A barium tablet was not administered.  IMPRESSION: 1. The  study is limited due to the inability to have the patient in the full upright position. There is relatively high-grade distal esophageal obstruction or stricture. A small amount of barium did pass. Direct visualization is recommended. 2. The cervical and proximal and mid thoracic esophagus are unremarkable.   Electronically Signed   By: David  Martinique M.D.   On: 08/22/2014 09:28     Impression / Plan:   Shanaiya Bene is a 81 y.o. y/o female with esophageal dysphagia with high grade distal esophageal stricture appearing on imaging.  Given her COPD exacerbation and chronic respiratory failure, would maximize her respiratory status prior to consideration of esophageal dilation with Dr. Allen Norris.  I have discussed risks & benefits which include, but are not limited to, bleeding, infection, perforation & drug reaction.  The patient agrees with this plan & written consent will be obtained.    Plan: #1 dysphagia 2 diet #2 EGD with Dr. Allen Norris with esophageal dilation once respiratory status improved #3 continue PPI  Thank you for involving me in the care of this patient.  The care of Ayva Veilleux will be discussed in direct collaboration with Dr Lucilla Lame, Attending Gastroenterologist.   LOS: 4 days  Vickey Huger, NP  08/22/2014, 4:45 PM [facility]  [place], [place] [address] Phone: [phone] Fax : [phone]

## 2014-08-22 NOTE — Progress Notes (Signed)
[facility] [place] NAME: Tricia Miller    MR#:  [id number]  DATE OF BIRTH:  10-27-1929  SUBJECTIVE:  CHIEF COMPLAINT:   Chief Complaint  Patient presents with  . Weakness   SOB some improvement. Dizzy, nausea persistent for a month. Constipation. Dysphagia, chronic. But now worse along with regurgitating swallowed food. No bleeding.   REVIEW OF SYSTEMS:    Review of Systems  Constitutional: Positive for malaise/fatigue. Negative for fever and chills.  HENT: Negative for sore throat.   Eyes: Negative for blurred vision, double vision and pain.  Respiratory: Positive for cough, shortness of breath and wheezing. Negative for hemoptysis.   Cardiovascular: Negative for chest pain, palpitations, orthopnea and leg swelling.  Gastrointestinal: Positive for nausea and vomiting. Negative for heartburn, abdominal pain, diarrhea and constipation.  Genitourinary: Negative for dysuria and hematuria.  Musculoskeletal: Negative for back pain and joint pain.  Skin: Negative for rash.  Neurological: Positive for dizziness and weakness. Negative for sensory change, speech change, focal weakness and headaches.  Endo/Heme/Allergies: Does not bruise/bleed easily.  Psychiatric/Behavioral: Negative for depression. The patient is not nervous/anxious.       DRUG ALLERGIES:   Allergies  Allergen Reactions  . Bactrim [Sulfamethoxazole-Trimethoprim]   . Ciprofloxacin   . Codeine   . Felodipine   . Levaquin [Levofloxacin In D5w]   . Morphine And Related   . Penicillins   . Pregabalin   . Trimethoprim     VITALS:  Blood pressure 138/90, pulse 123, temperature 97.8 F (36.6 C), temperature source Axillary, resp. rate 24, height 4\' 11"  (1.499 m), weight 88.587 kg (195 lb 4.8 oz), SpO2 97 %.  PHYSICAL EXAMINATION:   Physical Exam  GENERAL:  81 y.o.-year-old patient lying in the bed with no acute distress. Anxious EYES: Pupils equal,  round, reactive to light and accommodation. No scleral icterus. Extraocular muscles intact.  HEENT: Head atraumatic, normocephalic. Oropharynx and nasopharynx clear.  NECK:  Supple, no jugular venous distention. No thyroid enlargement, no tenderness.  LUNGS: Normal breath sounds CARDIOVASCULAR: S1, S2 normal. No murmurs, rubs, or gallops.  ABDOMEN: Soft, nontender, nondistended. Bowel sounds present. No organomegaly or mass.  EXTREMITIES: No cyanosis, clubbing or edema b/l.    NEUROLOGIC: Cranial nerves II through XII are intact. No focal Motor or sensory deficits b/l.   PSYCHIATRIC: The patient is alert and oriented x 3.  SKIN: No obvious rash, lesion, or ulcer.    LABORATORY PANEL:   CBC  Recent Labs Lab 08/18/14 1950 08/22/14 0444  WBC 11.1*  --   HGB 11.4*  --   HCT 36.2  --   PLT 290 217   ------------------------------------------------------------------------------------------------------------------  Chemistries   Recent Labs Lab 08/19/14 0705  08/21/14 0447  NA  --   < > 135  K 3.5  < > 3.4*  CL  --   < > 98*  CO2  --   < > 31  GLUCOSE  --   < > 87  BUN  --   < > 11  CREATININE  --   < > 0.49  CALCIUM  --   < > 7.9*  MG 1.5*  --   --   < > = values in this interval not displayed. ------------------------------------------------------------------------------------------------------------------  Cardiac Enzymes  Recent Labs Lab 08/19/14 0705  TROPONINI <0.03   ------------------------------------------------------------------------------------------------------------------  RADIOLOGY:  Dg Chest 2 View  08/21/2014   CLINICAL DATA:  Cough.  Shortness of breath.  Wheezing.  EXAM: CHEST  2 VIEW  COMPARISON:  08/18/2014  FINDINGS: Cardiac silhouette is mildly enlarged. No convincing mediastinal or hilar masses or adenopathy. There are small bilateral pleural effusions with associated dependent atelectasis. Lungs are hyperexpanded. No convincing pneumonia or  pulmonary edema. No pneumothorax.  Bony thorax is demineralized but grossly intact.  IMPRESSION: 1. Mild cardiomegaly and small bilateral effusions with associated dependent atelectasis. No convincing pneumonia or pulmonary edema.   Electronically Signed   By: Lajean Manes M.D.   On: 08/21/2014 14:33   Mr Brain Wo Contrast  08/21/2014   CLINICAL DATA:  Vertigo. History of pituitary macro adenoma surgery. History of lymphoma.  EXAM: MRI HEAD WITHOUT CONTRAST  TECHNIQUE: Multiplanar, multiecho pulse sequences of the brain and surrounding structures were obtained without intravenous contrast.  COMPARISON:  None.  FINDINGS: Mass in the sella and suprasellar cistern measures approximately 22 x 12 mm. This has T1 shortening centrally. This may be a cystic lesion with a fluid level noted on axial T2 imaging. There is compression of the optic chiasm due to mass-effect. Intravenous access was unsuccessful for contrast infusion. In addition, there is a well-defined defect in the clivus on the left which may be a surgical defect or related to tumor extension into the skullbase. This area measures approximately 10 x 15 mm. Both of these areas show hyperintensity on diffusion-weighted imaging. No invasion of the cavernous sinus identified.  Negative for acute infarct. Mild to moderate chronic microvascular ischemic changes in the white matter bilaterally. Negative for cortical infarct.  Ventricle size normal. Mild atrophy. Prior resection of the nasal septum. Mild mucosal edema in the paranasal sinuses. Mucosal edema in the mastoid sinus bilaterally right greater than left.  IMPRESSION: 22 x 12 mm mass in the sella and suprasellar cistern with compression of the optic chiasm. This may contain blood products with a fluid level. 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 Defect in the clivus on the left may represent tumor  extension or a postsurgical defect.  The patient did not receive intravenous contrast due to lack of IV access. If IV access is obtained, MRI could be repeated with contrast. High-resolution dedicated pituitary protocol should be performed if repeat imaging with IV contrast is possible. Comparison prior MRI would be helpful to evaluate for interval change.   Electronically Signed   By: Franchot Gallo M.D.   On: 08/21/2014 14:42   Dg Esophagus  08/22/2014   CLINICAL DATA:  Dysphagia to solids some liquids  EXAM: ESOPHOGRAM/BARIUM SWALLOW  TECHNIQUE: Single contrast examination was performed using thin barium. The study was limited to having the patient in the supine and semi upright positions due to her clinical status.  FLUOROSCOPY TIME:  Fluoroscopy Time:  1 minutes, 30 seconds  Number of Acquired Images:  19  COMPARISON:  Chest x-ray of August 21, 2014  FINDINGS: The patient ingested the thin barium without difficulty. No laryngeal penetration of the barium was observed. The cervical and proximal and mid esophageal function was normal. However, at the level of the distal esophagus the barium did not immediately passed. With moving the patient in the semi upright position a small amount passed into the stomach. Normal distension of the distal esophagus and GE junction was never observed. A barium tablet was not administered.  IMPRESSION: 1. The study is limited due to the inability to have the patient in the full upright position. There  is relatively high-grade distal esophageal obstruction or stricture. A small amount of barium did pass. Direct visualization is recommended. 2. The cervical and proximal and mid thoracic esophagus are unremarkable.   Electronically Signed   By: David  Martinique M.D.   On: 08/22/2014 09:28     ASSESSMENT AND PLAN:   81 year old Caucasian female history of COPD, paroxysmal A. fib presenting with shortness of breath.  * Dysphagia Chronic but now worsening. Barium study showed  esophageal stricture. Consult GI  * COPD exacerbation with chronic resp failure -IV steroids, Antibiotics - Scheduled Nebulizers - Inhalers -Wean O2 as tolerated Repeat CXR shows no pneumonia.  * Vertigo Started meclizine. Made it scheduled. Reglan TID AC Has had it on and off chronically but now persistent. [facility].  * Hypokalemia Scheduled daily dose.  * Hypomagnesemia - replace thru IV  * Paroxysmal A. fib: Currently rate controlled Continue meds  * h/o Pituitary tumor. Stable on repeat MRI compared to [facility]. No vision changes.  All the records are reviewed and case discussed with Care Management/Social Workerr. Management plans discussed with the patient, family and they are in agreement.  DVT Prophylaxis: SCDs  TOTAL TIME TAKING CARE OF THIS PATIENT: 35 minutes.    [name] R M.D on 08/22/2014 at 11:26 AM  Between 7am to 6pm - Pager - [phone]  After 6pm go to [email or web address] - password [email or web address] [facility]  Office  [phone]  CC: Primary care physician; Kingsley Callander, MD

## 2014-08-22 NOTE — Progress Notes (Signed)
   08/22/14 1100  Clinical Encounter Type  Visited With Patient and family together  Visit Type Initial  Consult/Referral To Chaplain  Visited with patient and her daughter in room.  Pt said she was very tired and not feeling very well.  She told me that "all these medicines aren't making me feel good."  Pt was calm, but appeared to be tired and did not appear to look as though she felt very well.  Pt and her daughter both thanked me for coming to visit with them.  [place] [phone]

## 2014-08-22 NOTE — Progress Notes (Signed)
Called to room by pt.  Pt bleeding from under her left ear.  Pt has what appears to be a small abrasion from scratching that is bleeding.  Pt has blood on her left hand and in her hair as well as on a tissue.  Held pressure for couple minutes, bleeding stopped and area was cleaned and covered with pink foam.  Pt advised to try not to scratch. Pt denies pain.  Will continue to assess.  Clarise Cruz, RN

## 2014-08-22 NOTE — Progress Notes (Signed)
PT Cancellation Note  Patient Details Name: Jerriah Ines MRN: [id number] DOB: 1929/04/12   Cancelled Treatment:    Reason Eval/Treat Not Completed: Patient declined, no reason specified (Pt declined d/t not feeling well; nursing staff present and aware.)  Will re-attempt PT eval at a later date/time as able/appropriate.   Raquel Sarna Labrandon Knoch 08/22/2014, 12:20 PM Leitha Bleak, [place]

## 2014-08-23 ENCOUNTER — Telehealth: Payer: Self-pay | Admitting: Urgent Care

## 2014-08-23 DIAGNOSIS — R1310 Dysphagia, unspecified: Secondary | ICD-10-CM

## 2014-08-23 LAB — BASIC METABOLIC PANEL
Anion gap: 5 (ref 5–15)
BUN: 10 mg/dL (ref 6–20)
CHLORIDE: 98 mmol/L — AB (ref 101–111)
CO2: 34 mmol/L — AB (ref 22–32)
CREATININE: 0.52 mg/dL (ref 0.44–1.00)
Calcium: 8.1 mg/dL — ABNORMAL LOW (ref 8.9–10.3)
GFR calc Af Amer: >60 mL/min (ref >60)
GFR calc non Af Amer: >60 mL/min (ref >60)
GLUCOSE: 81 mg/dL (ref 65–99)
Potassium: 3.7 mmol/L (ref 3.5–5.1)
Sodium: 137 mmol/L (ref 135–145)

## 2014-08-23 NOTE — Care Management (Signed)
Important Message  Patient Details  Name: Tricia Miller MRN: [id number] Date of Birth: 1929-11-09   Medicare Important Message Given:  Yes-third notification given    Juliann Pulse A Allmond 08/23/2014, 1:32 PM

## 2014-08-23 NOTE — Progress Notes (Signed)
[facility] [place] NAME: Tricia Miller    MR#:  [id number]  DATE OF BIRTH:  [birth year]  SUBJECTIVE:  CHIEF COMPLAINT:   Chief Complaint  Patient presents with  . Weakness   SOB some improvement. Dizzy, nausea persistent for a month. Constipation. Dysphagia, chronic. But now worse along with regurgitating swallowed food. No bleeding.   REVIEW OF SYSTEMS:    Review of Systems  Constitutional: Positive for malaise/fatigue. Negative for fever and chills.  HENT: Negative for sore throat.   Eyes: Negative for blurred vision, double vision and pain.  Respiratory: Positive for cough, shortness of breath and wheezing. Negative for hemoptysis.   Cardiovascular: Negative for chest pain, palpitations, orthopnea and leg swelling.  Gastrointestinal: Positive for nausea and vomiting. Negative for heartburn, abdominal pain, diarrhea and constipation.  Genitourinary: Negative for dysuria and hematuria.  Musculoskeletal: Negative for back pain and joint pain.  Skin: Negative for rash.  Neurological: Positive for dizziness and weakness. Negative for sensory change, speech change, focal weakness and headaches.  Endo/Heme/Allergies: Does not bruise/bleed easily.  Psychiatric/Behavioral: Negative for depression. The patient is not nervous/anxious.       DRUG ALLERGIES:   Allergies  Allergen Reactions  . Bactrim [Sulfamethoxazole-Trimethoprim]   . Ciprofloxacin   . Codeine   . Felodipine   . Levaquin [Levofloxacin In D5w]   . Morphine And Related   . Penicillins   . Pregabalin   . Trimethoprim     VITALS:  Blood pressure 115/56, pulse 67, temperature 98.5 F (36.9 C), temperature source Oral, resp. rate 18, height 4\' 11"  (1.499 m), weight 88.587 kg (195 lb 4.8 oz), SpO2 97 %.  PHYSICAL EXAMINATION:   Physical Exam  GENERAL:  81 y.o.-year-old patient lying in the bed with no acute distress. Anxious EYES: Pupils equal, round,  reactive to light and accommodation. No scleral icterus. Extraocular muscles intact.  HEENT: Head atraumatic, normocephalic. Oropharynx and nasopharynx clear.  NECK:  Supple, no jugular venous distention. No thyroid enlargement, no tenderness.  LUNGS: Coarse breath sounds CARDIOVASCULAR: S1, S2 normal. No murmurs, rubs, or gallops.  ABDOMEN: Soft, nontender, nondistended. Bowel sounds present. No organomegaly or mass.  EXTREMITIES: No cyanosis, clubbing or edema b/l.    NEUROLOGIC: Cranial nerves II through XII are intact. No focal Motor or sensory deficits b/l.   PSYCHIATRIC: The patient is alert and oriented x 3.  SKIN: No obvious rash, lesion, or ulcer.    LABORATORY PANEL:   CBC  Recent Labs Lab 08/18/14 1950 08/22/14 0444  WBC 11.1*  --   HGB 11.4*  --   HCT 36.2  --   PLT 290 217   ------------------------------------------------------------------------------------------------------------------  Chemistries   Recent Labs Lab 08/19/14 0705  08/23/14 0426  NA  --   < > 137  K 3.5  < > 3.7  CL  --   < > 98*  CO2  --   < > 34*  GLUCOSE  --   < > 81  BUN  --   < > 10  CREATININE  --   < > 0.52  CALCIUM  --   < > 8.1*  MG 1.5*  --   --   < > = values in this interval not displayed. ------------------------------------------------------------------------------------------------------------------  Cardiac Enzymes  Recent Labs Lab 08/19/14 0705  TROPONINI <0.03   ------------------------------------------------------------------------------------------------------------------  RADIOLOGY:  Dg Esophagus  08/22/2014   CLINICAL DATA:  Dysphagia to solids some liquids  EXAM: ESOPHOGRAM/BARIUM  SWALLOW  TECHNIQUE: Single contrast examination was performed using thin barium. The study was limited to having the patient in the supine and semi upright positions due to her clinical status.  FLUOROSCOPY TIME:  Fluoroscopy Time:  1 minutes, 30 seconds  Number of Acquired Images:   19  COMPARISON:  Chest x-ray of August 21, 2014  FINDINGS: The patient ingested the thin barium without difficulty. No laryngeal penetration of the barium was observed. The cervical and proximal and mid esophageal function was normal. However, at the level of the distal esophagus the barium did not immediately passed. With moving the patient in the semi upright position a small amount passed into the stomach. Normal distension of the distal esophagus and GE junction was never observed. A barium tablet was not administered.  IMPRESSION: 1. The study is limited due to the inability to have the patient in the full upright position. There is relatively high-grade distal esophageal obstruction or stricture. A small amount of barium did pass. Direct visualization is recommended. 2. The cervical and proximal and mid thoracic esophagus are unremarkable.   Electronically Signed   By: David  Martinique M.D.   On: 08/22/2014 09:28     ASSESSMENT AND PLAN:   81 year old Caucasian female history of COPD, paroxysmal A. fib presenting with shortness of breath.  * Dysphagia Chronic but now worsening. Barium study showed esophageal stricture. Consult GI-recommending outpatient EGD once her respiratory status is better, as patient is able to swallow up to some extent  * COPD exacerbation with chronic resp failure -IV steroids, Antibiotics - Scheduled Nebulizers - Inhalers -Wean O2 as tolerated Repeat CXR shows no pneumonia.  * Vertigo-chronic but worse lately Started meclizine. Made it scheduled. Reglan TID AC Has had it on and off chronically but now persistent. Seen at [facility]. Outpatient follow-up is recommended  * Hypokalemia Scheduled daily dose.  * Hypomagnesemia - replace thru IV  * Paroxysmal A. fib: Currently rate controlled Continue meds  * h/o Pituitary tumor. Stable on repeat MRI compared to [facility]. No vision changes.  All the records are reviewed and case discussed with Care Management/Social  Workerr. Management plans discussed with the patient, family and they are in agreement.  DVT Prophylaxis: SCDs  TOTAL TIME TAKING CARE OF THIS PATIENT: 35 minutes.    Nicholes Mango M.D on 08/23/2014 at 3:34 PM  Between 7am to 6pm - Pager - [phone]  After 6pm go to [email or web address] - password [email or web address] [place] [facility]  Office  [phone]  CC: Primary care physician; Kingsley Callander, MD

## 2014-08-23 NOTE — Progress Notes (Signed)
Subjective: Pt has tolerated soft diet well.  No regurgitation or vomiting.  Mild upper abdominal discomfort.  Objective: Vital signs in last 24 hours: Temp:  [97.2 F (36.2 C)-98 F (36.7 C)] 97.2 F (36.2 C) (07/07 0523) Pulse Rate:  [69-79] 69 (07/07 0523) Resp:  [20] 20 (07/07 0523) BP: (114-137)/(51-70) 137/70 mmHg (07/07 0523) SpO2:  [97 %-100 %] 99 % (07/07 0744) FiO2 (%):  [28 %] 28 % (07/07 0744) Last BM Date: 08/22/14 No LMP recorded. Patient has had a hysterectomy. Body mass index is 39.42 kg/(m^2). General:   Alert,  pleasant and cooperative in NAD Head:  Normocephalic and atraumatic. Eyes:  Sclera clear, no icterus.   Conjunctiva pink. Mouth:  No deformity or lesions, oropharynx pink & moist. Neck:  Supple; no masses or thyromegaly. Heart:  Regular rate and rhythm; no murmurs, clicks, rubs, or gallops. Lungs:  Lungs with posterior expiratory wheezes bilaterally. Abdomen:   Obese.  Normal bowel sounds.  Soft, nontender and nondistended. No masses, hepatosplenomegaly or hernias noted.  No guarding or rebound tenderness.  Exam limited given body habitus. Msk:  Symmetrical without gross deformities. Good equal movement & strength bilaterally. Pulses:  Normal pulses noted. Extremities:  Without clubbing or edema.  No cyanosis Neurologic:  Alert and  oriented x3;  grossly normal neurologically. Skin:  Intact without significant lesions or rashes. Cervical Nodes:  No significant cervical adenopathy. Psych:  Alert and cooperative. Normal mood and affect.  Intake/Output from previous day: 07/06 0701 - 07/07 0700 In: 600 [P.O.:600] Out: -   Lab Results:  Recent Labs  08/22/14 0444  PLT 217   BMET  Recent Labs  08/21/14 0447 08/23/14 0426  NA 135 137  K 3.4* 3.7  CL 98* 98*  CO2 31 34*  GLUCOSE 87 81  BUN 11 10  CREATININE 0.49 0.52  CALCIUM 7.9* 8.1*   Studies/Results: Dg Chest 2 View  08/21/2014   CLINICAL DATA:  Cough.  Shortness of breath.   Wheezing.  EXAM: CHEST  2 VIEW  COMPARISON:  08/18/2014  FINDINGS: Cardiac silhouette is mildly enlarged. No convincing mediastinal or hilar masses or adenopathy. There are small bilateral pleural effusions with associated dependent atelectasis. Lungs are hyperexpanded. No convincing pneumonia or pulmonary edema. No pneumothorax.  Bony thorax is demineralized but grossly intact.  IMPRESSION: 1. Mild cardiomegaly and small bilateral effusions with associated dependent atelectasis. No convincing pneumonia or pulmonary edema.   Electronically Signed   By: Lajean Manes M.D.   On: 08/21/2014 14:33   Mr Brain Wo Contrast  08/21/2014   CLINICAL DATA:  Vertigo. History of pituitary macro adenoma surgery. History of lymphoma.  EXAM: MRI HEAD WITHOUT CONTRAST  TECHNIQUE: Multiplanar, multiecho pulse sequences of the brain and surrounding structures were obtained without intravenous contrast.  COMPARISON:  None.  FINDINGS: Mass in the sella and suprasellar cistern measures approximately 22 x 12 mm. This has T1 shortening centrally. This may be a cystic lesion with a fluid level noted on axial T2 imaging. There is compression of the optic chiasm due to mass-effect. Intravenous access was unsuccessful for contrast infusion. In addition, there is a well-defined defect in the clivus on the left which may be a surgical defect or related to tumor extension into the skullbase. This area measures approximately 10 x 15 mm. Both of these areas show hyperintensity on diffusion-weighted imaging. No invasion of the cavernous sinus identified.  Negative for acute infarct. Mild to moderate chronic microvascular ischemic changes in the white matter  bilaterally. Negative for cortical infarct.  Ventricle size normal. Mild atrophy. Prior resection of the nasal septum. Mild mucosal edema in the paranasal sinuses. Mucosal edema in the mastoid sinus bilaterally right greater than left.  IMPRESSION: 22 x 12 mm mass in the sella and suprasellar  cistern with compression of the optic chiasm. This may contain blood products with a fluid level. 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 Defect in the clivus on the left may represent tumor extension or a postsurgical defect.  The patient did not receive intravenous contrast due to lack of IV access. If IV access is obtained, MRI could be repeated with contrast. High-resolution dedicated pituitary protocol should be performed if repeat imaging with IV contrast is possible. Comparison prior MRI would be helpful to evaluate for interval change.   Electronically Signed   By: Franchot Gallo M.D.   On: 08/21/2014 14:42   Dg Esophagus  08/22/2014   CLINICAL DATA:  Dysphagia to solids some liquids  EXAM: ESOPHOGRAM/BARIUM SWALLOW  TECHNIQUE: Single contrast examination was performed using thin barium. The study was limited to having the patient in the supine and semi upright positions due to her clinical status.  FLUOROSCOPY TIME:  Fluoroscopy Time:  1 minutes, 30 seconds  Number of Acquired Images:  19  COMPARISON:  Chest x-ray of August 21, 2014  FINDINGS: The patient ingested the thin barium without difficulty. No laryngeal penetration of the barium was observed. The cervical and proximal and mid esophageal function was normal. However, at the level of the distal esophagus the barium did not immediately passed. With moving the patient in the semi upright position a small amount passed into the stomach. Normal distension of the distal esophagus and GE junction was never observed. A barium tablet was not administered.  IMPRESSION: 1. The study is limited due to the inability to have the patient in the full upright position. There is relatively high-grade distal esophageal obstruction or stricture. A small amount of barium did pass. Direct visualization is recommended. 2. The cervical and proximal and mid thoracic  esophagus are unremarkable.   Electronically Signed   By: David  Martinique M.D.   On: 08/22/2014 09:28    Assessment: Esophageal dysphagia with high grade distal esophageal stricture:  Doing well on soft diet.  Will need EGD with ED once respiratory status optimized  Plan: #1  dysphagia 2 diet #2 EGD with Dr. Allen Norris with esophageal dilation once respiratory status improved outpatient #3 continue PPI I will sign off.  Please call if any further GI concerns or questions.  We would like to thank you for the opportunity to participate in the care of Shalisha Clausing.  The care of Kamyia Thomason will be discussed in direct collaboration with Dr Lucilla Lame, Attending Gastroenterologist.  LOS: 5 days  Vickey Huger  08/23/2014, 11:04 AM [facility]  [place], [place] [address] Phone: [phone] Fax : [phone]

## 2014-08-23 NOTE — Evaluation (Signed)
Physical Therapy Evaluation Patient Details Name: Tricia Miller MRN: [id number] DOB: 29-Oct-1929 Today's Date: 08/23/2014   History of Present Illness  Pt is an 81 y.o. female presenting to hospital with general fatigue, weakness, and SOB.  Pt noted to have esophageal stricture on barium study.  MRI of brain appears stable compared to [facility] per MD note.  Pt recently diagnosed with PNA.  Pt was at [facility] since end of June for rehab.  PMH includes:  COPD, paroxysmal a-fib, vertigo (since brain surgery about 2.5 years ago)..  Clinical Impression  Currently pt demonstrates impairments with strength, activity tolerance, pain, and anticipate pt will have limitations with functional mobility d/t recent immobility but unable to formally assess d/t pt refusing to get OOB (pt agreeable to in bed ex's though today).  Prior to recent hospital admissions (pt most recently at [facility]), pt was ambulating short distances in her home with RW.  Pt lives with her daughter in 1 level home with 6 STE B railing.  Pt would benefit from skilled PT to address above noted impairments and anticipated functional limitations.  Recommend pt discharge to STR (pending pt's continued level of participation with PT) when medically appropriate.  Pt currently refusing STR and requesting to go home with HHPT (pt reports that she likes HHPT).     Follow Up Recommendations SNF    Equipment Recommendations       Recommendations for Other Services       Precautions / Restrictions Precautions Precautions: Fall Restrictions Weight Bearing Restrictions: No      Mobility  Bed Mobility Overal bed mobility: Needs Assistance Bed Mobility: Rolling Rolling: Min assist (logrolling to L for repositioning)         General bed mobility comments: vc's required for hand placement and to use siderail for assist  Transfers                 General transfer comment: Unable to assess; pt refused  Ambulation/Gait             General Gait Details: Unable to assess; pt refused  Stairs            Wheelchair Mobility    Modified Rankin (Stroke Patients Only)       Balance Overall balance assessment:  (Unable to assess; pt refused OOB)                                           Pertinent Vitals/Pain Pain Assessment: Faces Faces Pain Scale: Hurts even more (Pt would not specifically rate on 0/10 scale) Pain Location: "everywhere" Pain Descriptors / Indicators: Sore Pain Intervention(s): Limited activity within patient's tolerance;Monitored during session (Pt reports recent pain medication)  Pt HR in 60's throughout session and O2 >94% throughout session on room air.    Home Living Family/patient expects to be discharged to:: Private residence Living Arrangements: Children (Lives with daughter)   Type of Home: House Home Access: Stairs to enter Entrance Stairs-Rails: Right;Left;Can reach both Technical brewer of Steps: 6 Home Layout: One level Home Equipment: Environmental consultant - 4 wheels;Bedside commode;Wheelchair - [employer] - 2 wheels (walk-in shower with bench)      Prior Function Level of Independence: Needs assistance   Gait / Transfers Assistance Needed: Independent short distances in home with RW and used bedside commode           Hand Dominance  Extremity/Trunk Assessment   Upper Extremity Assessment: Generalized weakness;RUE deficits/detail (L UE generalized weakness) RUE Deficits / Details: R elbow extension limited (pt reports baseline d/t prior surgeries); R elbow flexion 2+/5; R shoulder flexion 2+/5         Lower Extremity Assessment: Generalized weakness (unable to formally assess d/t pt refusing to get OOB; B DF to 5 degrees; B knees partially flexed and unable to straighten with PROM; limited L hip flexion (to grossly 60 degrees)--soft end feel)         Communication   Communication: No difficulties  Cognition  Arousal/Alertness: Awake/alert Behavior During Therapy: WFL for tasks assessed/performed Overall Cognitive Status: Within Functional Limits for tasks assessed                      General Comments  Nursing cleared pt for participation in physical therapy.  Pt agreeable to limited eval and in bed ex's but declined OOB d/t not feeling well/feeling fatigued.    Exercises   Performed semi-supine B LE therapeutic exercise x 10 reps:  Ankle pumps (AROM B LE's); SAQ's (AAROM R; AAROM L); heelslides (AAROM R; AAROM L), hip abd/adduction (AAROM R; AAROM L).  Pt required vc's and tactile cues for correct technique with exercises.  Ex's performed within comfortable range for pt d/t complaints of overall general body pain.       Assessment/Plan    PT Assessment Patient needs continued PT services  PT Diagnosis Generalized weakness   PT Problem List Decreased strength;Decreased range of motion;Decreased activity tolerance;Decreased mobility;Pain  PT Treatment Interventions DME instruction;Gait training;Stair training;Functional mobility training;Therapeutic activities;Therapeutic exercise;Balance training;Patient/family education   PT Goals (Current goals can be found in the Care Plan section) Acute Rehab PT Goals Patient Stated Goal: To go home PT Goal Formulation: With patient Time For Goal Achievement: 09/06/14 Potential to Achieve Goals: Fair    Frequency Min 2X/week   Barriers to discharge Decreased caregiver support      Co-evaluation               End of Session   Activity Tolerance: Patient limited by pain Patient left: in bed;with call bell/phone within reach;with bed alarm set           Time: 1200-1225 PT Time Calculation (min) (ACUTE ONLY): 25 min   Charges:   PT Evaluation $Initial PT Evaluation Tier I: 1 Procedure     PT G CodesLeitha Miller 09/04/14, 3:42 PM Tricia Miller, [place]

## 2014-08-23 NOTE — Telephone Encounter (Signed)
Pt needs an EGD/ED with Dr Allen Norris for esophageal stricture once discharged home from [facility] & resp status has improved.  Please call her in next week or so to see if she is ready to proceed. Thanks

## 2014-08-23 NOTE — Clinical Social Work Note (Signed)
Clinical Social Worker is continuing to follow for discharge planning needs. Pt is able to return to [facility] when medically stable. It is questionable at this time as pt is declining PT. Family is aware that pt may opt to return home. CSW will continue to follow.   Darden Dates, MSW, LCSW Clinical Social Worker  [phone]

## 2014-08-23 NOTE — Plan of Care (Addendum)
Problem: Discharge Progression Outcomes Goal: Discharge plan in place and appropriate Individualization:  Pt resides at [facility] since the 25th of June. She was discharged from [facility] to [facility] for rehab following a fall. Pt has been using a wheelchair at [facility].  Pt is incontinent at times.  Takes pills whole.  Outcome: Progressing Pt resides at [facility] since the 25th of June. She was discharged from [facility] to [facility] for rehab following a fall. Pt has been using a wheelchair at [facility].  Pt is incontinent at times.  Takes pills whole.     Goal: Other Discharge Outcomes/Goals Plan of Care Progressing to Goal: Pain meds given with noted relief. VSS. Nausea meds given with relief. Patient has poor appetite. Receiving IV solumedrol and IV antibiotics.

## 2014-08-23 NOTE — Plan of Care (Signed)
Problem: Discharge Progression Outcomes Goal: Other Discharge Outcomes/Goals Outcome: Progressing Patient is alert and oriented, c/o pain in stomach and legs. Toradol given x1. C/o nausea, zofran IV given x1 and Phenergan suppository given x1 with some relief. Incontinent of urine and stool. Resting quietly.

## 2014-08-24 MED ORDER — POTASSIUM CHLORIDE 20 MEQ PO PACK: 20.0000 meq | PACK | Freq: Every day | ORAL | Status: DC

## 2014-08-24 MED ORDER — NYSTATIN 100000 UNIT/ML MT SUSP: 5.0000 mL | Freq: Four times a day (QID) | OROMUCOSAL | Status: AC

## 2014-08-24 MED ORDER — MECLIZINE HCL 25 MG PO TABS: 25.0000 mg | ORAL_TABLET | Freq: Three times a day (TID) | ORAL | Status: DC

## 2014-08-24 MED ORDER — PROMETHAZINE HCL 25 MG PO TABS: 25.0000 mg | ORAL_TABLET | Freq: Four times a day (QID) | ORAL | Status: DC | PRN

## 2014-08-24 MED ORDER — POTASSIUM CHLORIDE 20 MEQ PO PACK: 20.0000 meq | PACK | Freq: Every day | ORAL | Status: AC

## 2014-08-24 MED ORDER — IPRATROPIUM-ALBUTEROL 0.5-2.5 (3) MG/3ML IN SOLN: 3.0000 mL | Freq: Four times a day (QID) | RESPIRATORY_TRACT | Status: AC | PRN

## 2014-08-24 MED ORDER — ACETAMINOPHEN 325 MG PO TABS: 650.0000 mg | ORAL_TABLET | Freq: Four times a day (QID) | ORAL | Status: AC | PRN

## 2014-08-24 MED ORDER — PREDNISONE 10 MG (21) PO TBPK: 10.0000 mg | ORAL_TABLET | Freq: Every day | ORAL | Status: DC

## 2014-08-24 MED ORDER — BISACODYL 5 MG PO TBEC: 10.0000 mg | DELAYED_RELEASE_TABLET | Freq: Every day | ORAL | Status: AC

## 2014-08-24 NOTE — Clinical Social Work Note (Signed)
Pt is ready for discharge today. Pt and daughter are in agreement with returning to [facility] for continued STR. Facility has received discharge information and facility is ready to admit pt. RN called report and EMS provided transportation. CSW is signing off as no further needs identified.   Darden Dates, MSW, LCSW Clinical Social Worker  [phone]

## 2014-08-24 NOTE — Plan of Care (Signed)
Problem: Discharge Progression Outcomes Goal: Other Discharge Outcomes/Goals VSS. Toradol givenx2 for abd pain and headach with improvement. C/o nausea tonight, zofran and pheneragan given. Pt verbalized reliefe.

## 2014-08-24 NOTE — Consult Note (Signed)
Palliative Medicine Inpatient Consult Note   Name: Tricia Miller Date: 7/72016 MRN: [id number]  DOB: 08-22-1929  Referring Physician: Nicholes Mango, MD  Palliative Care consult requested for this 81 y.o. female for goals of medical therapy in patient with a history of COPD, Lymphoma (little history is provided about this in the available record), a MACROadenoma of pituitary, falls, and a severe esophageal stricture causing emesis (with little going through on imaging), who came here with a COPD exacerbation and other symptoms of these problems.  I came to talk with her and had hoped to learn more about some of her conditions so that I would better understand the status of these, but she was sleeping soundly, and when wakened, did not seem to want to talk and instead went back to sleep.  I performed a chart review and limited exam nonetheless, hoping to talk with her and perhaps her daughter next time.     REVIEW OF SYSTEMS:  Patient is not able to provide ROS at this time.   SPIRITUAL SUPPORT SYSTEM: Not defined/ known as yet.  SOCIAL HISTORY:  reports that she has quit smoking. She does not have any smokeless tobacco history on file. She reports that she does not drink alcohol or use illicit drugs.  LEGAL DOCUMENTS:  None known about or located.   CODE STATUS: Limited code = DNI  PAST MEDICAL HISTORY: Past Medical History  Diagnosis Date  . COPD (chronic obstructive pulmonary disease)   . Vertigo   . Barrett esophagus   . Lymphoma of lymph nodes   . A-fib   . Pituitary macroadenoma     PAST SURGICAL HISTORY:  Past Surgical History  Procedure Laterality Date  . Abdominal hysterectomy    . Pituitary surgery    . Orthopedic surgery      ALLERGIES:  is allergic to bactrim; ciprofloxacin; codeine; felodipine; levaquin; morphine and related; penicillins; pregabalin; and trimethoprim.  MEDICATIONS:  Current Facility-Administered Medications  Medication Dose Route  Frequency Provider Last Rate Last Dose  . acetaminophen (TYLENOL) tablet 650 mg  650 mg Oral Q6H PRN Lytle Butte, MD   650 mg at 08/19/14 1808   Or  . acetaminophen (TYLENOL) suppository 650 mg  650 mg Rectal Q6H PRN Lytle Butte, MD      . albuterol (PROVENTIL) (2.5 MG/3ML) 0.083% nebulizer solution 2.5 mg  2.5 mg Nebulization Q6H PRN Lytle Butte, MD   2.5 mg at 08/21/14 9622  . bisacodyl (DULCOLAX) EC tablet 10 mg  10 mg Oral Daily [name], MD   10 mg at 08/23/14 1040  . fluticasone (FLONASE) 50 MCG/ACT nasal spray 2 spray  2 spray Each Nare Daily Lytle Butte, MD   2 spray at 08/23/14 1059  . heparin injection 5,000 Units  5,000 Units Subcutaneous 3 times per day Lytle Butte, MD   5,000 Units at 08/24/14 0615  . ipratropium-albuterol (DUONEB) 0.5-2.5 (3) MG/3ML nebulizer solution 3 mL  3 mL Nebulization TID Lytle Butte, MD   3 mL at 08/24/14 0740  . ketorolac (TORADOL) 15 MG/ML injection 15 mg  15 mg Intravenous Q6H PRN [name], MD   15 mg at 08/24/14 0211  . levothyroxine (SYNTHROID, LEVOTHROID) tablet 25 mcg  25 mcg Oral BH-q7a Lytle Butte, MD   25 mcg at 08/24/14 [phone]  . magnesium oxide (MAG-OX) tablet 400 mg  400 mg Oral Daily Lytle Butte, MD   400 mg at 08/23/14 1039  .  meclizine (ANTIVERT) tablet 25 mg  25 mg Oral TID [name], MD   25 mg at 08/23/14 2248  . methylPREDNISolone sodium succinate (SOLU-MEDROL) 125 mg/2 mL injection 60 mg  60 mg Intravenous Q24H Lytle Butte, MD   60 mg at 08/23/14 1041  . metoprolol succinate (TOPROL-XL) 24 hr tablet 12.5 mg  12.5 mg Oral Daily Lytle Butte, MD   12.5 mg at 08/23/14 1040  . nystatin (MYCOSTATIN) 100000 UNIT/ML suspension 500,000 Units  5 mL Oral QID [name], MD   500,000 Units at 08/23/14 2248  . ondansetron (ZOFRAN) injection 4 mg  4 mg Intravenous Q6H PRN [name], MD   4 mg at 08/24/14 0212  . oxyCODONE (Oxy IR/ROXICODONE) immediate release tablet 5 mg  5 mg Oral Q6H PRN [name], MD   5 mg  at 08/21/14 2137  . pantoprazole (PROTONIX) EC tablet 40 mg  40 mg Oral Daily Lytle Butte, MD   40 mg at 08/23/14 1040  . polyethylene glycol (MIRALAX / GLYCOLAX) packet 17 g  17 g Oral Daily PRN [name], MD   17 g at 08/22/14 1035  . potassium chloride SA (K-DUR,KLOR-CON) CR tablet 40 mEq  40 mEq Oral Q4H PRN Srikar Sudini, MD      . primidone (MYSOLINE) tablet 50 mg  50 mg Oral BID Lytle Butte, MD   50 mg at 08/23/14 2248  . promethazine (PHENERGAN) suppository 25 mg  25 mg Rectal Q6H PRN [name], MD   25 mg at 08/23/14 2346  . sertraline (ZOLOFT) tablet 200 mg  200 mg Oral Daily Lytle Butte, MD   200 mg at 08/23/14 2248    Vital Signs: BP 144/63 mmHg  Pulse 68  Temp(Src) 97.9 F (36.6 C) (Oral)  Resp 18  Ht 4\' 11"  (1.499 m)  Wt 88.587 kg (195 lb 4.8 oz)  BMI 39.42 kg/m2  SpO2 98% Filed Weights   08/19/14 0050  Weight: 88.587 kg (195 lb 4.8 oz)    Estimated body mass index is 39.42 kg/(m^2) as calculated from the following:   Height as of this encounter: 4\' 11"  (1.499 m).   Weight as of this encounter: 88.587 kg (195 lb 4.8 oz).  PERFORMANCE STATUS (ECOG) : 3 - Symptomatic, >50% confined to bed  PHYSICAL EXAM: Pt is sleeping as I enter the room and when I wakened her, she did not seem to want to be wakened and went back to sleep.   Heart rrr no mgr Lungs cta Abd soft and NT  Skin warm and dry  LABS: CBC:    Component Value Date/Time   WBC 11.1* 08/18/2014 1950   HGB 11.4* 08/18/2014 1950   HCT 36.2 08/18/2014 1950   PLT 217 08/22/2014 0444   MCV 81.6 08/18/2014 1950   NEUTROABS 8.0* 08/18/2014 1950   LYMPHSABS 1.9 08/18/2014 1950   MONOABS 0.9 08/18/2014 1950   EOSABS 0.2 08/18/2014 1950   BASOSABS 0.1 08/18/2014 1950   Comprehensive Metabolic Panel:    Component Value Date/Time   NA 137 08/23/2014 0426   K 3.7 08/23/2014 0426   CL 98* 08/23/2014 0426   CO2 34* 08/23/2014 0426   BUN 10 08/23/2014 0426   CREATININE 0.52 08/23/2014 0426    GLUCOSE 81 08/23/2014 0426   CALCIUM 8.1* 08/23/2014 0426   AST 41 08/13/2014 1140   ALT 22 08/13/2014 1140   ALKPHOS 192* 08/13/2014 1140   BILITOT 0.6 08/13/2014 1140   PROT  6.6 08/13/2014 1140   ALBUMIN 3.0* 08/13/2014 1140    IMPRESSION: Advanced COPD --Had Pneumonia diagnosed on 6/27 in the ER but on return here on day of admission (7/2), she has only COPD but no infiltrate seen and no pneumonia diagnosed.   Macroadenoma  --with concerning CT findings here (optic nerve compression, fluid and blood ) --however, thought to be w/o change from CT [facility]  Lymphoma   --nothing in the electronic medical record providing details --patient sleepy and not interactive today so will have to explore this history tomorrow  Eseophageal Stricture --with h/o Barretts Esophagus --hi grade with little going through into stomach --vomits after swallowing at times --for EGD with dilation --when resp status improves --now on dysphagia 2 diet and PPI  Constipation  --had diarrhea at home before coming in then no BM from 7/2 till recently (?)  Hypomagnesemia  Hypokalemia  Weak and Dizzy and Nauseated   Afib- Paroxysmal ?  Not on anticoagulant for this   HTN  Hypothyroidsim  Pain and headaches  Depression treated with zoloft  Falls (sent to [facility] for rehab after a fall and hosp stay at [facility])    PLAN: She will probably go back to [facility] for more rehab. Notes say she wishes to go home but family feels going to [place] would be best I did not get to talk with pt today as timing was off in that she wanted to nap.  Will have to revisit her.   She should probably rely on ENSURE or similar sustenance if vomiting of soft foods is going to keep happening.  She has medications for nausea and pain so these symtoms are being addressed. Will have to recheck her BM frequency next visit.  She is DNI but not a full DNR.  I am not sure about her lymphoma history nor am I sure what was told to  her about her macroadenoma management at [facility]. Will discuss with her attendings to see if I can add anything in terms of discussion about advanced age and wishes, directives, etc.       Time Spent: 50 minutes

## 2014-08-24 NOTE — Progress Notes (Addendum)
Initial Nutrition Assessment  INTERVENTION:  Meals and Snacks: Cater to patient preferences Medical Food Supplement Therapy: will recommend Mighty Shakes on meal trays TID and Magic Cup BID for added nutrition (each shake provides 300kcals and 9g protein)    NUTRITION DIAGNOSIS:  Swallowing difficulty related to dysphagia as evidenced by per patient/family report.  GOAL:  Patient will meet greater than or equal to 90% of their needs  MONITOR:   (Energy Intake, Electrolyte and Renal Profile, Digestive System)  REASON FOR ASSESSMENT:   (RD Screen, Length of Stay)    ASSESSMENT:  Pt admitted with COPD exacerbation and dysphagia s/p Barium swallow study; per MD note pt for EGD and esophageal dilation outpatient. Possible d/c today. PMHx:  Past Medical History  Diagnosis Date  . COPD (chronic obstructive pulmonary disease)   . Vertigo   . Barrett esophagus   . Lymphoma of lymph nodes   . A-fib   . Pituitary macroadenoma     Diet Order:  DIET SOFT Room service appropriate?: Yes; Fluid consistency:: Thin  Current Nutrition: Pt reports eating 0% of lunch today.   Food/Nutrition-Related History: Pt reports poor po intake since admission (day 6). Pt reports regurgitation after all meals and liquids. Average po intake <50% of meals. Difficult to clarify intake PTA as pt very upset regarding pending discharge on visit.   Medications: Mag-Ox, Kcl, Protonix, dulcolax  Electrolyte/Renal Profile and Glucose Profile:   Recent Labs Lab 08/19/14 0705 08/20/14 0550 08/21/14 0447 08/23/14 0426  NA  --  136 135 137  K 3.5 2.8* 3.4* 3.7  CL  --  95* 98* 98*  CO2  --  34* 31 34*  BUN  --  11 11 10   CREATININE  --  0.60 0.49 0.52  CALCIUM  --  8.0* 7.9* 8.1*  MG 1.5*  --   --   --   GLUCOSE  --  111* 87 81   Protein Profile: No results for input(s): ALBUMIN in the last 168 hours.  Gastrointestinal Profile: Last BM: 7/7   Nutrition-Focused Physical Exam Findings:  Nutrition-Focused physical exam completed. Findings are WDL for fat depletion, muscle depletion, and edema.    Weight Change: Pt reports stable weight. RD notes 8% weight difference per [facility] Anthropometrics:   Height:  Ht Readings from Last 1 Encounters:  08/19/14 4\' 11"  (1.499 m)    Weight:  Wt Readings from Last 1 Encounters:  08/19/14 195 lb 4.8 oz (88.587 kg)    Ideal Body Weight:   44.5kg  Wt Readings from Last 10 Encounters:  08/19/14 195 lb 4.8 oz (88.587 kg)  08/21/14 195 lb (88.451 kg)  08/13/14 212 lb 15.4 oz (96.599 kg)    BMI:  Body mass index is 39.42 kg/(m^2).  Estimated Nutritional Needs:  Kcal:  1160-1369kcals, BEE: 810kcals, TEE: (IF 1.1-1.3)(AF 1.3) using IBW of 44.5kg  Protein:  45-54g protein (1.0-1.2g/kg) using IBW of 44.5kg  Fluid:  1113-1342mL of fluid (25-37mL/kg) using IBW of 44.5kg  Skin:  Reviewed, no issues  EDUCATION NEEDS:  Education needs no appropriate at this time    [place], RD, LDN Pager [phone]

## 2014-08-24 NOTE — Plan of Care (Signed)
Problem: Discharge Progression Outcomes Goal: Other Discharge Outcomes/Goals Outcome: Completed/Met Date Met:  08/24/14 MD making rounds. Discharge orders received to return to [facility]. Judson Roch, SW facilitating transfer to [facility]. VSS except O2 sat 84% on RA. Placed on 2L O2, which increased O2 sat to 96%. No sob noted. MD and SW made aware of situation. Order received to transfer to [facility] with 2L O2 via [place]. SW facilitated transfer. Report called to [place], LPN at [employer]. No unanswered questions. EMS paged for transport to [facility]. Awaiting EMS.

## 2014-08-24 NOTE — Progress Notes (Signed)
Discharged via EMS. Belongings sent with EMS.

## 2014-08-24 NOTE — Discharge Summary (Addendum)
[facility] [place] NAME: Tricia Miller    MR#:  [id number]  DATE OF BIRTH:  [birth year]  DATE OF ADMISSION:  08/18/2014 ADMITTING PHYSICIAN: Lytle Butte, MD  DATE OF DISCHARGE: 08/24/2014 PRIMARY CARE PHYSICIAN: Kingsley Callander, MD    ADMISSION DIAGNOSIS:  Hypokalemia [E87.6] Dyspnea [R06.00] COPD exacerbation [J44.1]  DISCHARGE DIAGNOSIS:  Active Problems:   COPD exacerbation   Dysphagia  vertigo  SECONDARY DIAGNOSIS:   Past Medical History  Diagnosis Date  . COPD (chronic obstructive pulmonary disease)   . Vertigo   . Barrett esophagus   . Lymphoma of lymph nodes   . A-fib   . Pituitary macroadenoma     HOSPITAL COURSE:    81 year old Caucasian female history of COPD, paroxysmal A. fib presenting with shortness of breath.  * Dysphagia Chronic but now worsening. Barium study showed esophageal stricture. Consult GI-recommending outpatient EGD once her respiratory status is better, as patient is able to swallow soft diet  * COPD exacerbation with chronic resp failure -Improved with IV steroids, Antibiotics. -Completed antibiotic course - Scheduled Nebulizers - Inhalers -needs oxygen 2 L via nasal cannula as patient desaturated Repeat CXR shows no pneumonia. -Discharge with steroid tapering dose  * Vertigo-chronic but worse lately Started meclizine scheduled. Feels better Reglan TID AC Has had it on and off chronically but now persistent. Seen at [facility]. Outpatient follow-up with [facility] ENT in a month is recommended  * Hypokalemia Scheduled daily dose.  * Hypomagnesemia - replace thru IV  * Paroxysmal A. fib: Currently rate controlled Continue meds  * h/o Pituitary tumor. Stable on repeat MRI compared to [facility]. No vision changes. Outpatient follow-up with [facility] neurology is recommended in a month  * Generalized weakness-disposition PT is recommending skilled nursing care. Discussed this with  patient as well as her daughter, daughter has requested skilled nursing care  Have discussed plan of care in detail with the patient and her daughter Ms. Virl Cagey over phone, agreeable with the current plan of care  DISCHARGE CONDITIONS:   Satisfactory  CONSULTS OBTAINED:  Treatment Team:  Nicholes Mango, MD Gastroenterology Dr. Allen Norris  PROCEDURES none  DRUG ALLERGIES:   Allergies  Allergen Reactions  . Bactrim [Sulfamethoxazole-Trimethoprim]   . Ciprofloxacin   . Codeine   . Felodipine   . Levaquin [Levofloxacin In D5w]   . Morphine And Related   . Penicillins   . Pregabalin   . Trimethoprim     DISCHARGE MEDICATIONS:   Current Discharge Medication List    START taking these medications   Details  acetaminophen (TYLENOL) 325 MG tablet Take 2 tablets (650 mg total) by mouth every 6 (six) hours as needed for mild pain (or Fever >/= 101).    bisacodyl (DULCOLAX) 5 MG EC tablet Take 2 tablets (10 mg total) by mouth daily. Qty: 30 tablet, Refills: 0    ipratropium-albuterol (DUONEB) 0.5-2.5 (3) MG/3ML SOLN Take 3 mLs by nebulization every 6 (six) hours as needed. Qty: 360 mL, Refills: 1    meclizine (ANTIVERT) 25 MG tablet Take 1 tablet (25 mg total) by mouth 3 (three) times daily. Qty: 30 tablet, Refills: 0    nystatin (MYCOSTATIN) 100000 UNIT/ML suspension Take 5 mLs (500,000 Units total) by mouth 4 (four) times daily. Qty: 60 mL, Refills: 0    potassium chloride (KLOR-CON) 20 MEQ packet Take 20 mEq by mouth daily. Qty: 10 packet, Refills: 0    predniSONE (STERAPRED UNI-PAK 21 TAB)  10 MG (21) TBPK tablet Take 1 tablet (10 mg total) by mouth daily. Take 6 tablets by mouth for 1 day followed by  5 tablets by mouth for 1 day followed by  4 tablets by mouth for 1 day followed by  3 tablets by mouth for 1 day followed by  2 tablets by mouth for 1 day followed by  1 tablet by mouth for a day and stop Qty: 21 tablet, Refills: 0      CONTINUE these medications which  have CHANGED   Details  promethazine (PHENERGAN) 25 MG tablet Take 1 tablet (25 mg total) by mouth every 6 (six) hours as needed for nausea or vomiting. Qty: 30 tablet, Refills: 0      CONTINUE these medications which have NOT CHANGED   Details  albuterol (PROVENTIL HFA;VENTOLIN HFA) 108 (90 BASE) MCG/ACT inhaler Inhale 2 puffs into the lungs every 4 (four) hours as needed for wheezing or shortness of breath.    albuterol (PROVENTIL) (2.5 MG/3ML) 0.083% nebulizer solution Take 2.5 mg by nebulization every 4 (four) hours as needed for wheezing or shortness of breath.    diclofenac sodium (VOLTAREN) 1 % GEL Apply 2 g topically 4 (four) times daily.    fluticasone (FLONASE) 50 MCG/ACT nasal spray Place 2 sprays into both nostrils daily.    furosemide (LASIX) 20 MG tablet Take 20 mg by mouth daily as needed for edema.    levothyroxine (SYNTHROID, LEVOTHROID) 25 MCG tablet Take 25 mcg by mouth every morning.    magnesium oxide (MAG-OX) 400 MG tablet Take 400 mg by mouth daily.    metoprolol succinate (TOPROL-XL) 25 MG 24 hr tablet Take 12.5 mg by mouth daily.    omeprazole (PRILOSEC) 40 MG capsule Take 40 mg by mouth every morning.    primidone (MYSOLINE) 50 MG tablet Take 50 mg by mouth 2 (two) times daily.    scopolamine (TRANSDERM-SCOP) 1 MG/3DAYS Place 1 patch onto the skin every 3 (three) days.    sertraline (ZOLOFT) 100 MG tablet Take 200 mg by mouth daily.    triamcinolone (KENALOG) 0.025 % cream Apply 1 application topically 2 (two) times daily.      STOP taking these medications     ibuprofen (ADVIL,MOTRIN) 800 MG tablet      ipratropium (ATROVENT HFA) 17 MCG/ACT inhaler          DISCHARGE INSTRUCTIONS:   Follow-up with primary care physician in a week, PCP to consider repeating BMP in a week Follow-up with gastroenterology in 1 week to 10 days for outpatient EGD Follow-up with [facility] ENT in a month  Follow-up with [facility] neurology in a month  DIET:  Cardiac  diet  DISCHARGE CONDITION:  Fair  ACTIVITY:  Activity as tolerated  OXYGEN:  Home Oxygen: yes   Oxygen Delivery: 2 lit [place]  DISCHARGE LOCATION:  nursing home -snf  If you experience worsening of your admission symptoms, develop shortness of breath, life threatening emergency, suicidal or homicidal thoughts you must seek medical attention immediately by calling 911 or calling your MD immediately  if symptoms less severe.  You Must read complete instructions/literature along with all the possible adverse reactions/side effects for all the Medicines you take and that have been prescribed to you. Take any new Medicines after you have completely understood and accpet all the possible adverse reactions/side effects.   Please note  You were cared for by a hospitalist during your hospital stay. If you have any questions about your discharge  medications or the care you received while you were in the hospital after you are discharged, you can call the unit and asked to speak with the hospitalist on call if the hospitalist that took care of you is not available. Once you are discharged, your primary care physician will handle any further medical issues. Please note that NO REFILLS for any discharge medications will be authorized once you are discharged, as it is imperative that you return to your primary care physician (or establish a relationship with a primary care physician if you do not have one) for your aftercare needs so that they can reassess your need for medications and monitor your lab values.     Today  Chief Complaint  Patient presents with  . Weakness   Patient is feeling better today with vertigo is  Improved. Nauseous and Phenergan is helping  ROS:  CONSTITUTIONAL: Denies fevers, chills. Denies any fatigue, weakness.  EYES: Denies blurry vision, double vision, eye pain. EARS, NOSE, THROAT: Denies tinnitus, ear pain, hearing loss. RESPIRATORY: Denies cough, wheeze, shortness  of breath.  CARDIOVASCULAR: Denies chest pain, palpitations, edema.  GASTROINTESTINAL: Denies  vomiting, diarrhea, abdominal pain. Denies bright red blood per rectum. GENITOURINARY: Denies dysuria, hematuria. ENDOCRINE: Denies nocturia or thyroid problems. HEMATOLOGIC AND LYMPHATIC: Denies easy bruising or bleeding. SKIN: Denies rash or lesion. MUSCULOSKELETAL: Denies pain in neck, back, shoulder, knees, hips or arthritic symptoms.  NEUROLOGIC: Denies paralysis, paresthesias.  PSYCHIATRIC: Denies anxiety or depressive symptoms.   VITAL SIGNS:  Blood pressure 110/77, pulse 74, temperature 98.3 F (36.8 C), temperature source Oral, resp. rate 18, height 4\' 11"  (1.499 m), weight 88.587 kg (195 lb 4.8 oz), SpO2 84 %.  I/O:    Intake/Output Summary (Last 24 hours) at 08/24/14 1148 Last data filed at 08/24/14 0800  Gross per 24 hour  Intake      0 ml  Output      0 ml  Net      0 ml    PHYSICAL EXAMINATION:  GENERAL:  81 y.o.-year-old patient lying in the bed with no acute distress.  EYES: Pupils equal, round, reactive to light and accommodation. No scleral icterus. Extraocular muscles intact.  HEENT: Head atraumatic, normocephalic. Oropharynx and nasopharynx clear.  NECK:  Supple, no jugular venous distention. No thyroid enlargement, no tenderness.  LUNGS: Normal breath sounds bilaterally, no wheezing, rales,rhonchi or crepitation. No use of accessory muscles of respiration.  CARDIOVASCULAR: S1, S2 normal. No murmurs, rubs, or gallops.  ABDOMEN: Soft, non-tender, non-distended. Bowel sounds present. No organomegaly or mass.  EXTREMITIES: No pedal edema, cyanosis, or clubbing.  NEUROLOGIC: Cranial nerves II through XII are intact. Muscle strength 5/5 in all extremities. Sensation intact. Gait not checked.  PSYCHIATRIC: The patient is alert and oriented x 3.  SKIN: No obvious rash, lesion, or ulcer.   DATA REVIEW:   CBC  Recent Labs Lab 08/18/14 1950 08/22/14 0444  WBC 11.1*   --   HGB 11.4*  --   HCT 36.2  --   PLT 290 217    Chemistries   Recent Labs Lab 08/19/14 0705  08/23/14 0426  NA  --   < > 137  K 3.5  < > 3.7  CL  --   < > 98*  CO2  --   < > 34*  GLUCOSE  --   < > 81  BUN  --   < > 10  CREATININE  --   < > 0.52  CALCIUM  --   < >  8.1*  MG 1.5*  --   --   < > = values in this interval not displayed.  Cardiac Enzymes  Recent Labs Lab 08/19/14 0705  TROPONINI <0.03    Microbiology Results  Results for orders placed or performed during the hospital encounter of 08/13/14  Culture, blood (routine x 2)     Status: None   Collection Time: 08/13/14  2:16 PM  Result Value Ref Range Status   Specimen Description BLOOD  Final   Special Requests NONE  Final   Culture NO GROWTH 5 DAYS  Final   Report Status 08/18/2014 FINAL  Final  Culture, blood (routine x 2)     Status: None   Collection Time: 08/13/14  2:17 PM  Result Value Ref Range Status   Specimen Description BLOOD  Final   Special Requests NONE  Final   Culture NO GROWTH 5 DAYS  Final   Report Status 08/18/2014 FINAL  Final    RADIOLOGY:  Dg Chest 2 View  08/21/2014   CLINICAL DATA:  Cough.  Shortness of breath.  Wheezing.  EXAM: CHEST  2 VIEW  COMPARISON:  08/18/2014  FINDINGS: Cardiac silhouette is mildly enlarged. No convincing mediastinal or hilar masses or adenopathy. There are small bilateral pleural effusions with associated dependent atelectasis. Lungs are hyperexpanded. No convincing pneumonia or pulmonary edema. No pneumothorax.  Bony thorax is demineralized but grossly intact.  IMPRESSION: 1. Mild cardiomegaly and small bilateral effusions with associated dependent atelectasis. No convincing pneumonia or pulmonary edema.   Electronically Signed   By: Lajean Manes M.D.   On: 08/21/2014 14:33   Mr Brain Wo Contrast  08/21/2014   CLINICAL DATA:  Vertigo. History of pituitary macro adenoma surgery. History of lymphoma.  EXAM: MRI HEAD WITHOUT CONTRAST  TECHNIQUE: Multiplanar,  multiecho pulse sequences of the brain and surrounding structures were obtained without intravenous contrast.  COMPARISON:  None.  FINDINGS: Mass in the sella and suprasellar cistern measures approximately 22 x 12 mm. This has T1 shortening centrally. This may be a cystic lesion with a fluid level noted on axial T2 imaging. There is compression of the optic chiasm due to mass-effect. Intravenous access was unsuccessful for contrast infusion. In addition, there is a well-defined defect in the clivus on the left which may be a surgical defect or related to tumor extension into the skullbase. This area measures approximately 10 x 15 mm. Both of these areas show hyperintensity on diffusion-weighted imaging. No invasion of the cavernous sinus identified.  Negative for acute infarct. Mild to moderate chronic microvascular ischemic changes in the white matter bilaterally. Negative for cortical infarct.  Ventricle size normal. Mild atrophy. Prior resection of the nasal septum. Mild mucosal edema in the paranasal sinuses. Mucosal edema in the mastoid sinus bilaterally right greater than left.  IMPRESSION: 22 x 12 mm mass in the sella and suprasellar cistern with compression of the optic chiasm. This may contain blood products with a fluid level. 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 Defect in the clivus on the left may represent tumor extension or a postsurgical defect.  The patient did not receive intravenous contrast due to lack of IV access. If IV access is obtained, MRI could be repeated with contrast. High-resolution dedicated pituitary protocol should be performed if repeat imaging with IV contrast is possible. Comparison prior MRI would be helpful to evaluate for interval change.   Electronically Signed  By: Franchot Gallo M.D.   On: 08/21/2014 14:42   Dg Esophagus  08/22/2014   CLINICAL DATA:  Dysphagia to  solids some liquids  EXAM: ESOPHOGRAM/BARIUM SWALLOW  TECHNIQUE: Single contrast examination was performed using thin barium. The study was limited to having the patient in the supine and semi upright positions due to her clinical status.  FLUOROSCOPY TIME:  Fluoroscopy Time:  1 minutes, 30 seconds  Number of Acquired Images:  19  COMPARISON:  Chest x-ray of August 21, 2014  FINDINGS: The patient ingested the thin barium without difficulty. No laryngeal penetration of the barium was observed. The cervical and proximal and mid esophageal function was normal. However, at the level of the distal esophagus the barium did not immediately passed. With moving the patient in the semi upright position a small amount passed into the stomach. Normal distension of the distal esophagus and GE junction was never observed. A barium tablet was not administered.  IMPRESSION: 1. The study is limited due to the inability to have the patient in the full upright position. There is relatively high-grade distal esophageal obstruction or stricture. A small amount of barium did pass. Direct visualization is recommended. 2. The cervical and proximal and mid thoracic esophagus are unremarkable.   Electronically Signed   By: David  Martinique M.D.   On: 08/22/2014 09:28    EKG:   Orders placed or performed during the hospital encounter of 08/18/14  . ED EKG  . ED EKG  . EKG 12-Lead  . EKG 12-Lead  . EKG 12-Lead  . EKG 12-Lead  . EKG      Management plans discussed with the patient, family and they are in agreement.  CODE STATUS:     Code Status Orders        Start     Ordered   08/18/14 2336  Limited resuscitation (code)   Continuous    Question Answer Comment  In the event of cardiac or respiratory ARREST: Initiate Code Blue, Call Rapid Response Yes   In the event of cardiac or respiratory ARREST: Perform CPR Yes   In the event of cardiac or respiratory ARREST: Perform Intubation/Mechanical Ventilation No   In the  event of cardiac or respiratory ARREST: Use NIPPV/BiPAp only if indicated Yes   In the event of cardiac or respiratory ARREST: Administer ACLS medications if indicated Yes   In the event of cardiac or respiratory ARREST: Perform Defibrillation or Cardioversion if indicated Yes      08/18/14 2335      TOTAL TIME TAKING CARE OF THIS PATIENT: 45 minutes.    @[facility] @  on 08/24/2014 at 11:48 AM  Between 7am to 6pm - Pager - [phone]  After 6pm go to [email or web address] - password [email or web address] [facility]  Office  [phone]  CC: Primary care physician; Kingsley Callander, MD

## 2014-09-09 ENCOUNTER — Emergency Department: Payer: Medicare Other

## 2014-09-09 ENCOUNTER — Inpatient Hospital Stay
Admission: EM | Admit: 2014-09-09 | Discharge: 2014-09-17 | DRG: 380 | Disposition: E | Payer: Medicare Other | Attending: Internal Medicine | Admitting: Internal Medicine

## 2014-09-09 ENCOUNTER — Encounter: Payer: Self-pay | Admitting: *Deleted

## 2014-09-09 DIAGNOSIS — J9600 Acute respiratory failure, unspecified whether with hypoxia or hypercapnia: Secondary | ICD-10-CM

## 2014-09-09 DIAGNOSIS — N179 Acute kidney failure, unspecified: Secondary | ICD-10-CM | POA: Diagnosis not present

## 2014-09-09 DIAGNOSIS — D175 Benign lipomatous neoplasm of intra-abdominal organs: Secondary | ICD-10-CM | POA: Diagnosis present

## 2014-09-09 DIAGNOSIS — K210 Gastro-esophageal reflux disease with esophagitis: Secondary | ICD-10-CM | POA: Diagnosis present

## 2014-09-09 DIAGNOSIS — Z8572 Personal history of non-Hodgkin lymphomas: Secondary | ICD-10-CM | POA: Diagnosis not present

## 2014-09-09 DIAGNOSIS — E872 Acidosis: Secondary | ICD-10-CM | POA: Diagnosis not present

## 2014-09-09 DIAGNOSIS — R1310 Dysphagia, unspecified: Secondary | ICD-10-CM

## 2014-09-09 DIAGNOSIS — Z882 Allergy status to sulfonamides status: Secondary | ICD-10-CM | POA: Diagnosis not present

## 2014-09-09 DIAGNOSIS — J441 Chronic obstructive pulmonary disease with (acute) exacerbation: Secondary | ICD-10-CM | POA: Diagnosis present

## 2014-09-09 DIAGNOSIS — Z515 Encounter for palliative care: Secondary | ICD-10-CM

## 2014-09-09 DIAGNOSIS — A419 Sepsis, unspecified organism: Secondary | ICD-10-CM | POA: Diagnosis not present

## 2014-09-09 DIAGNOSIS — K2270 Barrett's esophagus without dysplasia: Principal | ICD-10-CM | POA: Diagnosis present

## 2014-09-09 DIAGNOSIS — Z66 Do not resuscitate: Secondary | ICD-10-CM | POA: Diagnosis not present

## 2014-09-09 DIAGNOSIS — R627 Adult failure to thrive: Secondary | ICD-10-CM | POA: Diagnosis present

## 2014-09-09 DIAGNOSIS — B9562 Methicillin resistant Staphylococcus aureus infection as the cause of diseases classified elsewhere: Secondary | ICD-10-CM | POA: Diagnosis not present

## 2014-09-09 DIAGNOSIS — Z823 Family history of stroke: Secondary | ICD-10-CM

## 2014-09-09 DIAGNOSIS — Z8249 Family history of ischemic heart disease and other diseases of the circulatory system: Secondary | ICD-10-CM

## 2014-09-09 DIAGNOSIS — E876 Hypokalemia: Secondary | ICD-10-CM | POA: Diagnosis present

## 2014-09-09 DIAGNOSIS — Z9981 Dependence on supplemental oxygen: Secondary | ICD-10-CM | POA: Diagnosis not present

## 2014-09-09 DIAGNOSIS — Z87891 Personal history of nicotine dependence: Secondary | ICD-10-CM | POA: Diagnosis not present

## 2014-09-09 DIAGNOSIS — Z452 Encounter for adjustment and management of vascular access device: Secondary | ICD-10-CM

## 2014-09-09 DIAGNOSIS — R112 Nausea with vomiting, unspecified: Secondary | ICD-10-CM

## 2014-09-09 DIAGNOSIS — F329 Major depressive disorder, single episode, unspecified: Secondary | ICD-10-CM | POA: Diagnosis present

## 2014-09-09 DIAGNOSIS — I480 Paroxysmal atrial fibrillation: Secondary | ICD-10-CM | POA: Diagnosis present

## 2014-09-09 DIAGNOSIS — Z885 Allergy status to narcotic agent status: Secondary | ICD-10-CM

## 2014-09-09 DIAGNOSIS — K449 Diaphragmatic hernia without obstruction or gangrene: Secondary | ICD-10-CM | POA: Diagnosis present

## 2014-09-09 DIAGNOSIS — Z9109 Other allergy status, other than to drugs and biological substances: Secondary | ICD-10-CM | POA: Diagnosis not present

## 2014-09-09 DIAGNOSIS — K5900 Constipation, unspecified: Secondary | ICD-10-CM | POA: Diagnosis present

## 2014-09-09 DIAGNOSIS — Z8701 Personal history of pneumonia (recurrent): Secondary | ICD-10-CM

## 2014-09-09 DIAGNOSIS — R0602 Shortness of breath: Secondary | ICD-10-CM

## 2014-09-09 DIAGNOSIS — G8929 Other chronic pain: Secondary | ICD-10-CM | POA: Diagnosis present

## 2014-09-09 DIAGNOSIS — X58XXXA Exposure to other specified factors, initial encounter: Secondary | ICD-10-CM | POA: Diagnosis not present

## 2014-09-09 DIAGNOSIS — J449 Chronic obstructive pulmonary disease, unspecified: Secondary | ICD-10-CM | POA: Diagnosis not present

## 2014-09-09 DIAGNOSIS — R6521 Severe sepsis with septic shock: Secondary | ICD-10-CM | POA: Diagnosis not present

## 2014-09-09 DIAGNOSIS — R197 Diarrhea, unspecified: Secondary | ICD-10-CM | POA: Diagnosis present

## 2014-09-09 DIAGNOSIS — E860 Dehydration: Secondary | ICD-10-CM | POA: Diagnosis present

## 2014-09-09 DIAGNOSIS — Z883 Allergy status to other anti-infective agents status: Secondary | ICD-10-CM | POA: Diagnosis not present

## 2014-09-09 DIAGNOSIS — J9601 Acute respiratory failure with hypoxia: Secondary | ICD-10-CM | POA: Diagnosis not present

## 2014-09-09 DIAGNOSIS — T17590A Other foreign object in bronchus causing asphyxiation, initial encounter: Secondary | ICD-10-CM | POA: Diagnosis not present

## 2014-09-09 DIAGNOSIS — Z978 Presence of other specified devices: Secondary | ICD-10-CM

## 2014-09-09 DIAGNOSIS — Z880 Allergy status to penicillin: Secondary | ICD-10-CM | POA: Diagnosis not present

## 2014-09-09 DIAGNOSIS — I10 Essential (primary) hypertension: Secondary | ICD-10-CM | POA: Diagnosis present

## 2014-09-09 DIAGNOSIS — J690 Pneumonitis due to inhalation of food and vomit: Secondary | ICD-10-CM | POA: Diagnosis not present

## 2014-09-09 DIAGNOSIS — Z4659 Encounter for fitting and adjustment of other gastrointestinal appliance and device: Secondary | ICD-10-CM

## 2014-09-09 DIAGNOSIS — I252 Old myocardial infarction: Secondary | ICD-10-CM

## 2014-09-09 LAB — CBC WITH DIFFERENTIAL/PLATELET
BASOS ABS: 0.1 10*3/uL (ref 0–0.1)
Basophils Relative: 1 %
Eosinophils Absolute: 0 10*3/uL (ref 0–0.7)
HCT: 41.7 % (ref 35.0–47.0)
Hemoglobin: 12.8 g/dL (ref 12.0–15.0)
Lymphocytes Relative: 19 %
Lymphs Abs: 2.4 10*3/uL (ref 1.0–3.6)
MCH: 24.9 pg — AB (ref 26.0–34.0)
MCHC: 30.6 g/dL — ABNORMAL LOW (ref 32.0–36.0)
MCV: 81.3 fL (ref 80.0–100.0)
Monocytes Absolute: 0.9 10*3/uL (ref 0.2–0.9)
Monocytes Relative: 7 %
Neutro Abs: 9.7 10*3/uL — ABNORMAL HIGH (ref 1.4–6.5)
Neutrophils Relative %: 73 %
PLATELETS: 193 10*3/uL (ref 150–440)
RBC: 5.12 MIL/uL (ref 3.80–5.20)
RDW: 20.3 % — AB (ref 11.5–14.5)
WBC: 13.2 10*3/uL — ABNORMAL HIGH (ref 3.6–11.0)

## 2014-09-09 LAB — COMPREHENSIVE METABOLIC PANEL
ALT: 12 U/L — ABNORMAL LOW (ref 14–54)
ANION GAP: 15 (ref 5–15)
AST: 26 U/L (ref 15–41)
Albumin: 3.1 g/dL — ABNORMAL LOW (ref 3.5–5.0)
Alkaline Phosphatase: 101 U/L (ref 38–126)
BUN: 28 mg/dL — AB (ref 6–20)
CO2: 22 mmol/L (ref 22–32)
CREATININE: 0.72 mg/dL (ref 0.44–1.00)
Calcium: 8 mg/dL — ABNORMAL LOW (ref 8.9–10.3)
Chloride: 105 mmol/L (ref 101–111)
GFR calc non Af Amer: >60 mL/min (ref >60)
Glucose, Bld: 104 mg/dL — ABNORMAL HIGH (ref 65–99)
Potassium: 2.8 mmol/L — CL (ref 3.5–5.1)
Sodium: 142 mmol/L (ref 135–145)
TOTAL PROTEIN: 6 g/dL — AB (ref 6.5–8.1)
Total Bilirubin: 0.8 mg/dL (ref 0.3–1.2)

## 2014-09-09 LAB — MAGNESIUM: Magnesium: 2 mg/dL (ref 1.7–2.4)

## 2014-09-09 LAB — LACTIC ACID, PLASMA
LACTIC ACID, VENOUS: 1.4 mmol/L (ref 0.5–2.0)
Lactic Acid, Venous: 1.3 mmol/L (ref 0.5–2.0)

## 2014-09-09 LAB — TROPONIN I
TROPONIN I: 0.03 ng/mL (ref <0.031)
Troponin I: 0.03 ng/mL (ref <0.031)

## 2014-09-09 LAB — LIPASE, BLOOD: Lipase: <10 U/L — ABNORMAL LOW (ref 22–51)

## 2014-09-09 MED ORDER — IOHEXOL 300 MG/ML  SOLN
100.0000 mL | Freq: Once | INTRAMUSCULAR | Status: AC | PRN
  Administered 2014-09-09: 100 mL via INTRAVENOUS

## 2014-09-09 MED ORDER — PANTOPRAZOLE SODIUM 40 MG IV SOLR
40.0000 mg | Freq: Two times a day (BID) | INTRAVENOUS | Status: DC
  Administered 2014-09-09 – 2014-09-13 (×8): 40 mg via INTRAVENOUS
  Filled 2014-09-09 (×9): qty 40

## 2014-09-09 MED ORDER — IPRATROPIUM-ALBUTEROL 0.5-2.5 (3) MG/3ML IN SOLN
3.0000 mL | Freq: Four times a day (QID) | RESPIRATORY_TRACT | Status: DC | PRN
  Filled 2014-09-09: qty 3

## 2014-09-09 MED ORDER — ONDANSETRON HCL 4 MG/2ML IJ SOLN
4.0000 mg | Freq: Four times a day (QID) | INTRAMUSCULAR | Status: DC | PRN
  Administered 2014-09-09 – 2014-09-12 (×4): 4 mg via INTRAVENOUS
  Filled 2014-09-09 (×4): qty 2

## 2014-09-09 MED ORDER — POTASSIUM CHLORIDE 10 MEQ/100ML IV SOLN
10.0000 meq | INTRAVENOUS | Status: AC
  Administered 2014-09-09 (×3): 10 meq via INTRAVENOUS
  Filled 2014-09-09 (×4): qty 100

## 2014-09-09 MED ORDER — HYDRALAZINE HCL 20 MG/ML IJ SOLN: 10.0000 mg | Freq: Four times a day (QID) | INTRAMUSCULAR | Status: DC | PRN

## 2014-09-09 MED ORDER — HEPARIN (PORCINE) IN NACL 100-0.45 UNIT/ML-% IJ SOLN
750.0000 [IU]/h | INTRAMUSCULAR | Status: DC
  Filled 2014-09-09: qty 250

## 2014-09-09 MED ORDER — MORPHINE SULFATE 4 MG/ML IJ SOLN
4.0000 mg | Freq: Once | INTRAMUSCULAR | Status: AC
  Administered 2014-09-09: 4 mg via INTRAVENOUS
  Filled 2014-09-09: qty 1

## 2014-09-09 MED ORDER — HEPARIN BOLUS VIA INFUSION
3800.0000 [IU] | Freq: Once | INTRAVENOUS | Status: DC
  Filled 2014-09-09: qty 3800

## 2014-09-09 MED ORDER — SODIUM CHLORIDE 0.9 % IJ SOLN: 3.0000 mL | INTRAMUSCULAR | Status: DC | PRN

## 2014-09-09 MED ORDER — MORPHINE SULFATE 2 MG/ML IJ SOLN
2.0000 mg | INTRAMUSCULAR | Status: DC | PRN
  Administered 2014-09-09 – 2014-09-12 (×9): 2 mg via INTRAVENOUS
  Filled 2014-09-09 (×9): qty 1

## 2014-09-09 MED ORDER — ONDANSETRON HCL 4 MG/2ML IJ SOLN
4.0000 mg | Freq: Once | INTRAMUSCULAR | Status: AC
  Administered 2014-09-09: 4 mg via INTRAVENOUS
  Filled 2014-09-09: qty 2

## 2014-09-09 MED ORDER — IOHEXOL 240 MG/ML SOLN
25.0000 mL | Freq: Once | INTRAMUSCULAR | Status: AC | PRN
  Administered 2014-09-09: 25 mL via ORAL

## 2014-09-09 MED ORDER — KCL IN DEXTROSE-NACL 40-5-0.45 MEQ/L-%-% IV SOLN
INTRAVENOUS | Status: DC
  Administered 2014-09-09: 22:00:00 via INTRAVENOUS
  Filled 2014-09-09 (×4): qty 1000

## 2014-09-09 NOTE — ED Notes (Signed)
Called in to patients room for chest pain. Pt having left side chest pain.

## 2014-09-09 NOTE — ED Provider Notes (Addendum)
[facility] Emergency Department Provider Note   ____________________________________________  Time seen: 1:30 PM I have reviewed the triage vital signs and the triage nursing note.  HISTORY  Chief Complaint Weakness   Historian Patient and her daughter  HPI Tricia Miller is a 81 y.o. female who is currently staying at a nursing home for acute care rehabilitation to regain her strength per her daughter. She is a history of pituitary tumor removal followed by deconditioning, and subsequent sustained when nursing rehabilitation center. Patient is here today because of vomiting this morning. She states that she has had some troubles in the past with nausea and generalized abdominal discomfort, however this morning the vomiting has been constant and not been able to keep anything down by mouth.One diarrhea movement this morning. No fever reported. No chest pain. Moderate shortness of breath which sounds like it might be more chronic. No blood in the emesis. Daughter reports history of stricture in the esophagus diagnosed by swallow study and a previous auscultation. Patient does have a history of A. fib but is not currently on any blood thinner because of being on NSAIDs for arthritic pain and concern about possible GI bleed, per the daughter.   Past Medical History  Diagnosis Date  . COPD (chronic obstructive pulmonary disease)   . Vertigo   . Barrett esophagus   . Lymphoma of lymph nodes   . A-fib   . Pituitary macroadenoma     Patient Active Problem List   Diagnosis Date Noted  . Dysphagia   . COPD exacerbation 08/18/2014    Past Surgical History  Procedure Laterality Date  . Abdominal hysterectomy    . Pituitary surgery    . Orthopedic surgery      Current Outpatient Rx  Name  Route  Sig  Dispense  Refill  . acetaminophen (TYLENOL) 325 MG tablet   Oral   Take 2 tablets (650 mg total) by mouth every 6 (six) hours as needed for mild pain (or  Fever >/= 101).         Marland Kitchen albuterol (PROVENTIL HFA;VENTOLIN HFA) 108 (90 BASE) MCG/ACT inhaler   Inhalation   Inhale 2 puffs into the lungs every 4 (four) hours as needed for wheezing or shortness of breath.         Marland Kitchen albuterol (PROVENTIL) (2.5 MG/3ML) 0.083% nebulizer solution   Nebulization   Take 2.5 mg by nebulization every 4 (four) hours as needed for wheezing or shortness of breath.         . bisacodyl (DULCOLAX) 5 MG EC tablet   Oral   Take 2 tablets (10 mg total) by mouth daily.   30 tablet   0   . diclofenac sodium (VOLTAREN) 1 % GEL   Topical   Apply 2 g topically 4 (four) times daily.         . fluticasone (FLONASE) 50 MCG/ACT nasal spray   Each Nare   Place 2 sprays into both nostrils daily.         . furosemide (LASIX) 20 MG tablet   Oral   Take 20 mg by mouth daily as needed for edema.         Marland Kitchen ipratropium-albuterol (DUONEB) 0.5-2.5 (3) MG/3ML SOLN   Nebulization   Take 3 mLs by nebulization every 6 (six) hours as needed.   360 mL   1   . levothyroxine (SYNTHROID, LEVOTHROID) 25 MCG tablet   Oral   Take 25 mcg by mouth every  morning.         . magnesium oxide (MAG-OX) 400 MG tablet   Oral   Take 400 mg by mouth daily.         . meclizine (ANTIVERT) 25 MG tablet   Oral   Take 1 tablet (25 mg total) by mouth 3 (three) times daily.   30 tablet   0   . metoprolol succinate (TOPROL-XL) 25 MG 24 hr tablet   Oral   Take 12.5 mg by mouth daily.         Marland Kitchen omeprazole (PRILOSEC) 40 MG capsule   Oral   Take 40 mg by mouth every morning.         Marland Kitchen EXPIRED: potassium chloride (KLOR-CON) 20 MEQ packet   Oral   Take 20 mEq by mouth daily.   10 packet   0   . predniSONE (STERAPRED UNI-PAK 21 TAB) 10 MG (21) TBPK tablet   Oral   Take 1 tablet (10 mg total) by mouth daily. Take 6 tablets by mouth for 1 day followed by  5 tablets by mouth for 1 day followed by  4 tablets by mouth for 1 day followed by  3 tablets by mouth for 1 day  followed by  2 tablets by mouth for 1 day followed by  1 tablet by mouth for a day and stop   21 tablet   0   . primidone (MYSOLINE) 50 MG tablet   Oral   Take 50 mg by mouth 2 (two) times daily.         . promethazine (PHENERGAN) 25 MG tablet   Oral   Take 1 tablet (25 mg total) by mouth every 6 (six) hours as needed for nausea or vomiting.   30 tablet   0   . scopolamine (TRANSDERM-SCOP) 1 MG/3DAYS   Transdermal   Place 1 patch onto the skin every 3 (three) days.         Marland Kitchen sertraline (ZOLOFT) 100 MG tablet   Oral   Take 200 mg by mouth daily.         Marland Kitchen triamcinolone (KENALOG) 0.025 % cream   Topical   Apply 1 application topically 2 (two) times daily.           Allergies Bactrim; Ciprofloxacin; Codeine; Felodipine; Levaquin; Morphine and related; Penicillins; Pregabalin; and Trimethoprim  Family History  Problem Relation Age of Onset  . Diabetes Neg Hx   . Colon cancer Neg Hx   . Liver disease Neg Hx     Social History History  Substance Use Topics  . Smoking status: Former Research scientist (life sciences)  . Smokeless tobacco: Not on file  . Alcohol Use: No    Review of Systems  Constitutional: Negative for fever. Eyes: Negative for visual changes. ENT: Negative for sore throat. Cardiovascular: Chest pain episode in the ED reports several episodes over the past couple weeks. Respiratory: Chronic shortness of breath. Hadn't needed O2 until she was in the acute care rehabilitation, and now she wears oxygen. Gastrointestinal: See history of present illness Genitourinary: Negative for dysuria. Musculoskeletal: Negative for back pain. Chronic muscular skeletal pain. Skin: Negative for rash. Neurological: Negative for headaches, focal weakness or numbness. 10 point Review of Systems otherwise negative ____________________________________________   PHYSICAL EXAM:  VITAL SIGNS: ED Triage Vitals  Enc Vitals Group     BP 09/08/2014 1240 137/91 mmHg     Pulse Rate 08/31/2014  1240 92     Resp 09/10/2014 1240 22  Temp 09/10/2014 1240 98 F (36.7 C)     Temp Source 08/30/2014 1240 Oral     SpO2 08/25/2014 1240 98 %     Weight 09/11/2014 1240 177 lb 14.4 oz (80.695 kg)     Height --      Head Cir --      Peak Flow --      Pain Score 09/03/2014 1242 8     Pain Loc --      Pain Edu? --      Excl. in [place]? --      Constitutional: Alert and oriented. Well appearing and in no distress. Eyes: Conjunctivae are normal. PERRL. Normal extraocular movements. ENT   Head: Normocephalic and atraumatic.   Nose: No congestion/rhinnorhea.   Mouth/Throat: Mucous membranes are mildly dry.   Neck: No stridor. Cardiovascular/Chest: Irregularly irregular. No tachycardia.  No murmurs, rubs, or gallops. Respiratory: Mild decreased air movement throughout all lung is without rhonchi or wheezing. Gastrointestinal: Soft. No distention, no guarding, no rebound. Moderate tenderness diffusely more concentrated in the epigastrium.  Genitourinary/rectal:Deferred Musculoskeletal: Nontender with normal range of motion in all extremities. No joint effusions.  No lower extremity tenderness nor edema. Neurologic:  Normal speech and language. No gross or focal neurologic deficits are appreciated. Skin:  Skin is warm, dry and intact. No rash noted. Psychiatric: Mood and affect are normal. Speech and behavior are normal. Patient exhibits appropriate insight and judgment.  ____________________________________________   EKG I, Lisa Roca, MD, the attending physician have personally viewed and interpreted all ECGs.  101 bpm. Sinus tachycardia with premature atrial complex. Narrow QRS. Normal axis. Nonspecific ST changes with minimal ST segment depression V2 through V5 with T-wave inversions inferior and laterally.  In comparison to prior EKG, these findings are similar but more pronounced with deeper T-wave inversions. ____________________________________________  LABS (pertinent  positives/negatives)  Metabolic panel significant for potassium of 2.8 Lipase normal Troponin 0.03 White blood count 13.2, hemoglobin 12.8  ____________________________________________  RADIOLOGY All Xrays were viewed by me. Imaging interpreted by Radiologist.  Acute abdomen chest: No acute findings on chest or abdomen __________________________________________  PROCEDURES  Procedure(s) performed: None Critical Care performed: None  ____________________________________________   ED COURSE / ASSESSMENT AND PLAN  CONSULTATIONS: None  Pertinent labs & imaging results that were available during my care of the patient were reviewed by me and considered in my medical decision making (see chart for details).  Patient is continuing to vomit here raising concern for obstruction. Her x-rays are nonspecific but there is a positive bowel gas. After blood work returns, patient will need CT scan. Patient care transferred to Dr. Jacqualine Code at shift change 3 PM. CT scan pending.   Patient / Family / Caregiver informed of clinical course, medical decision-making process, and agree with plan.   I discussed return precautions, follow-up instructions, and discharged instructions with patient and/or family.  ___________________________________________   FINAL CLINICAL IMPRESSION(S) / ED DIAGNOSES   Final diagnoses:  Nausea and vomiting, vomiting of unspecified type     Lisa Roca, MD 09/14/2014 1459  Lisa Roca, MD 08/30/[id number]

## 2014-09-09 NOTE — ED Provider Notes (Signed)
CT Abdomen Pelvis W Contrast (Final result) Result time: 08/29/2014 17:11:03   Final result by Rad Results In Interface (09/01/2014 17:11:03)   Narrative:   CLINICAL DATA: Vomiting weakness and productive cough. Worsened since last night.  EXAM: CT ABDOMEN AND PELVIS WITH CONTRAST  TECHNIQUE: Multidetector CT imaging of the abdomen and pelvis was performed using the standard protocol following bolus administration of intravenous contrast.  CONTRAST: 192mL OMNIPAQUE IOHEXOL 300 MG/ML SOLN  COMPARISON: Radiographs 09/12/2014  FINDINGS: Lower chest: No significant abnormality. Minor linear basilar scarring.  Hepatobiliary: There are multiple calculi within the gallbladder lumen. There is no gallbladder mural thickening or pericholecystic inflammatory change. There are multiple benign hepatic cysts measuring up to 2.7 cm. There is no significant liver lesion. There is no bile duct dilatation.  Pancreas: Fatty replaced. No pancreatic mass or ductal dilatation. No inflammatory changes.  Spleen: Normal  Adrenals/Urinary Tract: Benign left adrenal nodule containing fat and calcium consistent with a 1.8 cm myelolipoma. There are large benign renal cysts bilaterally, measuring up to 9.5 cm on the left and 7.3 cm on the right. Ureters and urinary bladder appear unremarkable.  Stomach/Bowel: There is a large hiatal hernia. The stomach, small bowel and colon are otherwise remarkable only for minimal uncomplicated colonic diverticulosis.  Vascular/Lymphatic: The abdominal aorta is normal in caliber. There is moderate atherosclerotic calcification. There is no adenopathy in the abdomen or pelvis.  Reproductive: Hysterectomy. No adnexal abnormalities.  Other: No acute inflammatory changes are evident in the abdomen or pelvis. There is no ascites.  Musculoskeletal: Multiple vertebral hemangiomata. No significant musculoskeletal lesion. Small fat containing inguinal  hernias incidentally noted bilaterally.  IMPRESSION: 1. No acute findings. 2. Cholelithiasis 3. Benign fat containing left adrenal nodule 4. Large hiatal hernia 5. Benign cysts of the liver and kidneys.     CT reveals no acute intra-abdominal pathology. Discussed with the hospitalist service will admit the patient for abdominal pain nausea vomiting hypokalemia and inability to tolerate by mouth. Further workup and evaluation per the hospitalist service.  Patient remains stable but is still nauseated not tolerating by mouth.   Delman Kitten, MD 08/18/2014 [phone]

## 2014-09-09 NOTE — H&P (Signed)
[facility] NAME: Tricia Miller    MR#:  [id number]  DATE OF BIRTH:  [birth year]  DATE OF ADMISSION:  09/03/2014  PRIMARY CARE PHYSICIAN: Kingsley Callander, MD   REQUESTING/REFERRING PHYSICIAN: Quale  CHIEF COMPLAINT:   Chief Complaint  Patient presents with  . Weakness    HISTORY OF PRESENT ILLNESS: Tricia Miller  is a 81 y.o. female with a known history of COPD, esophageal stricture, lymphoma, A. fib, pituitary macroadenoma was admitted to hospital 2 weeks ago for COPD at that time she had complain of dysphagia and barium swallow was done which showed she is his original stricture and she was advised to follow-up in GI clinic after discharge once her respiratory status improves. She was discharged to rehabilitation Center because of generalized weakness. Today history obtained from patient's daughter, as per her since last 2 weeks in rehabilitation patient is not able to eat enough, initially she was on antibiotic for her COPD and pneumonia on discharge- later the rehabilitation doctor started her on some antibiotic for UTI also. They tried different kind of diet. Patient is not able to eat any ,gradually getting weak so for her it was hard to participate with physical therapy.  For last 2-3 days since started vomiting with any kind of food or medication they tried to give her, and today the vomit was dark coffee-colored and she also had some diarrhea. Daughter denies any fever. Patient was not able to take any pain medications and so she started having pain all over her body in her back ,  Joints, shoulders and her abdomen.  Brought to emergency room, and she was noted to have low potassium, CT scan of abdomen was done to rule out any other abnormalities came out to be negative. Patient is drowsy during my examination because she received injection morphine for her pain.  PAST MEDICAL HISTORY:   Past Medical History   Diagnosis Date  . COPD (chronic obstructive pulmonary disease)   . Vertigo   . Barrett esophagus   . Lymphoma of lymph nodes   . A-fib   . Pituitary macroadenoma     PAST SURGICAL HISTORY:  Past Surgical History  Procedure Laterality Date  . Abdominal hysterectomy    . Pituitary surgery    . Orthopedic surgery      SOCIAL HISTORY:  History  Substance Use Topics  . Smoking status: Former Research scientist (life sciences)  . Smokeless tobacco: Not on file  . Alcohol Use: No    FAMILY HISTORY:  Family History  Problem Relation Age of Onset  . Diabetes Neg Hx   . Colon cancer Neg Hx   . Liver disease Neg Hx   . Coronary artery disease Mother   . Hypertension Father   . Stroke Father     DRUG ALLERGIES:  Allergies  Allergen Reactions  . Bactrim [Sulfamethoxazole-Trimethoprim]   . Ciprofloxacin   . Codeine   . Felodipine   . Levaquin [Levofloxacin In D5w]   . Morphine And Related   . Penicillins   . Pregabalin   . Trimethoprim     REVIEW OF SYSTEMS:   Pt is drowsy and not able to tell.  MEDICATIONS AT HOME:  Prior to Admission medications   Medication Sig Start Date End Date Taking? Authorizing Provider  acetaminophen (TYLENOL) 325 MG tablet Take 2 tablets (650 mg total) by mouth every 6 (six) hours as needed for mild pain (or Fever >/= 101). 08/24/14  Nicholes Mango, MD  albuterol (PROVENTIL HFA;VENTOLIN HFA) 108 (90 BASE) MCG/ACT inhaler Inhale 2 puffs into the lungs every 4 (four) hours as needed for wheezing or shortness of breath.    Historical Provider, MD  albuterol (PROVENTIL) (2.5 MG/3ML) 0.083% nebulizer solution Take 2.5 mg by nebulization every 4 (four) hours as needed for wheezing or shortness of breath.    Historical Provider, MD  bisacodyl (DULCOLAX) 5 MG EC tablet Take 2 tablets (10 mg total) by mouth daily. 08/24/14   Nicholes Mango, MD  diclofenac sodium (VOLTAREN) 1 % GEL Apply 2 g topically 4 (four) times daily.    Historical Provider, MD  fluticasone (FLONASE) 50 MCG/ACT  nasal spray Place 2 sprays into both nostrils daily.    Historical Provider, MD  furosemide (LASIX) 20 MG tablet Take 20 mg by mouth daily as needed for edema.    Historical Provider, MD  ipratropium-albuterol (DUONEB) 0.5-2.5 (3) MG/3ML SOLN Take 3 mLs by nebulization every 6 (six) hours as needed. 08/24/14   Nicholes Mango, MD  levothyroxine (SYNTHROID, LEVOTHROID) 25 MCG tablet Take 25 mcg by mouth every morning.    Historical Provider, MD  magnesium oxide (MAG-OX) 400 MG tablet Take 400 mg by mouth daily.    Historical Provider, MD  meclizine (ANTIVERT) 25 MG tablet Take 1 tablet (25 mg total) by mouth 3 (three) times daily. 08/24/14   Nicholes Mango, MD  metoprolol succinate (TOPROL-XL) 25 MG 24 hr tablet Take 12.5 mg by mouth daily.    Historical Provider, MD  omeprazole (PRILOSEC) 40 MG capsule Take 40 mg by mouth every morning.    Historical Provider, MD  potassium chloride (KLOR-CON) 20 MEQ packet Take 20 mEq by mouth daily. 08/24/14 09/03/14  Nicholes Mango, MD  predniSONE (STERAPRED UNI-PAK 21 TAB) 10 MG (21) TBPK tablet Take 1 tablet (10 mg total) by mouth daily. Take 6 tablets by mouth for 1 day followed by  5 tablets by mouth for 1 day followed by  4 tablets by mouth for 1 day followed by  3 tablets by mouth for 1 day followed by  2 tablets by mouth for 1 day followed by  1 tablet by mouth for a day and stop 08/24/14   Nicholes Mango, MD  primidone (MYSOLINE) 50 MG tablet Take 50 mg by mouth 2 (two) times daily.    Historical Provider, MD  promethazine (PHENERGAN) 25 MG tablet Take 1 tablet (25 mg total) by mouth every 6 (six) hours as needed for nausea or vomiting. 08/24/14   Nicholes Mango, MD  scopolamine (TRANSDERM-SCOP) 1 MG/3DAYS Place 1 patch onto the skin every 3 (three) days.    Historical Provider, MD  sertraline (ZOLOFT) 100 MG tablet Take 200 mg by mouth daily.    Historical Provider, MD  triamcinolone (KENALOG) 0.025 % cream Apply 1 application topically 2 (two) times daily.    Historical  Provider, MD      PHYSICAL EXAMINATION:   VITAL SIGNS: Blood pressure 127/93, pulse 72, temperature 98 F (36.7 C), temperature source Oral, resp. rate 10, weight 80.695 kg (177 lb 14.4 oz), SpO2 98 %.  GENERAL:  81 y.o.-year-old patient lying in the bed with no acute distress.  EYES: Pupils equal, round, reactive to light and accommodation. No scleral icterus. Extraocular muscles intact.  HEENT: Head atraumatic, normocephalic. Oropharynx and nasopharynx clear. Tongue dry. NECK:  Supple, no jugular venous distention. No thyroid enlargement, no tenderness.  LUNGS: Normal breath sounds bilaterally, no wheezing, rales,rhonchi or crepitation. No use  of accessory muscles of respiration.  CARDIOVASCULAR: S1, S2 normal. No murmurs, rubs, or gallops.  ABDOMEN: Soft, nontender, nondistended. Bowel sounds present. No organomegaly or mass.  EXTREMITIES: No pedal edema, cyanosis, or clubbing.  NEUROLOGIC: Drowsy, Opens eyes to stimuli, Moves limbs slowly , Not following commands. PSYCHIATRIC: Drowsy after Inj morphin.Marland Kitchen  SKIN: No obvious rash, lesion, or ulcer.   LABORATORY PANEL:   CBC  Recent Labs Lab 09/08/2014 1451  WBC 13.2*  HGB 12.8  HCT 41.7  PLT 193  MCV 81.3  MCH 24.9*  MCHC 30.6*  RDW 20.3*  LYMPHSABS 2.4  MONOABS 0.9  EOSABS 0.0  BASOSABS 0.1   ------------------------------------------------------------------------------------------------------------------  Chemistries   Recent Labs Lab 08/19/2014 1451  NA 142  K 2.8*  CL 105  CO2 22  GLUCOSE 104*  BUN 28*  CREATININE 0.72  CALCIUM 8.0*  AST 26  ALT 12*  ALKPHOS 101  BILITOT 0.8   ------------------------------------------------------------------------------------------------------------------ estimated creatinine clearance is 49.8 mL/min (by C-G formula based on Cr of 0.72). ------------------------------------------------------------------------------------------------------------------ No results for  input(s): TSH, T4TOTAL, T3FREE, THYROIDAB in the last 72 hours.  Invalid input(s): FREET3    Cardiac Enzymes  Recent Labs Lab 08/23/2014 1451  TROPONINI 0.03   ------------------------------------------------------------------------------------------------------------------ Invalid input(s): POCBNP  ---------------------------------------------------------------------------------------------------------------  Urinalysis No results found for: COLORURINE, APPEARANCEUR, LABSPEC, PHURINE, GLUCOSEU, HGBUR, BILIRUBINUR, KETONESUR, PROTEINUR, UROBILINOGEN, NITRITE, LEUKOCYTESUR   RADIOLOGY: Ct Abdomen Pelvis W Contrast  09/04/2014   CLINICAL DATA:  Vomiting weakness and productive cough. Worsened since last night.  EXAM: CT ABDOMEN AND PELVIS WITH CONTRAST  TECHNIQUE: Multidetector CT imaging of the abdomen and pelvis was performed using the standard protocol following bolus administration of intravenous contrast.  CONTRAST:  140mL OMNIPAQUE IOHEXOL 300 MG/ML  SOLN  COMPARISON:  Radiographs 08/24/2014  FINDINGS: Lower chest: No significant abnormality. Minor linear basilar scarring.  Hepatobiliary: There are multiple calculi within the gallbladder lumen. There is no gallbladder mural thickening or pericholecystic inflammatory change. There are multiple benign hepatic cysts measuring up to 2.7 cm. There is no significant liver lesion. There is no bile duct dilatation.  Pancreas: Fatty replaced. No pancreatic mass or ductal dilatation. No inflammatory changes.  Spleen: Normal  Adrenals/Urinary Tract: Benign left adrenal nodule containing fat and calcium consistent with a 1.8 cm myelolipoma. There are large benign renal cysts bilaterally, measuring up to 9.5 cm on the left and 7.3 cm on the right. Ureters and urinary bladder appear unremarkable.  Stomach/Bowel: There is a large hiatal hernia. The stomach, small bowel and colon are otherwise remarkable only for minimal uncomplicated colonic  diverticulosis.  Vascular/Lymphatic: The abdominal aorta is normal in caliber. There is moderate atherosclerotic calcification. There is no adenopathy in the abdomen or pelvis.  Reproductive: Hysterectomy.  No adnexal abnormalities.  Other: No acute inflammatory changes are evident in the abdomen or pelvis. There is no ascites.  Musculoskeletal: Multiple vertebral hemangiomata. No significant musculoskeletal lesion. Small fat containing inguinal hernias incidentally noted bilaterally.  IMPRESSION: 1. No acute findings. 2. Cholelithiasis 3. Benign fat containing left adrenal nodule 4. Large hiatal hernia 5. Benign cysts of the liver and kidneys.   Electronically Signed   By: Andreas Newport M.D.   On: 08/20/2014 17:11   Dg Abd Acute W/chest  08/25/2014   CLINICAL DATA:  Vomiting for 4 days, worse last night. Left side chest pain.  EXAM: DG ABDOMEN ACUTE W/ 1V CHEST  COMPARISON:  PA and lateral chest 08/21/2014.  CT chest 08/13/2014.  FINDINGS: Single view of the chest  demonstrates clear lungs and normal heart size. No pneumothorax or pleural effusion is identified.  Kidneys of the abdomen show no free intraperitoneal air. The bowel gas pattern is nonobstructive. Atherosclerotic vascular disease is noted.  IMPRESSION: No acute finding chest or abdomen.   Electronically Signed   By: Inge Rise M.D.   On: 09/07/2014 14:15   IMPRESSION AND PLAN:  * Esophageal Stricture, nausea and vomiting, coffee-ground colored vomit  Patient is known to have esophageal stricture, she was supposed to follow with GI clinic and she is appointment in first week of August. Has not been eating enough for last 2-3 weeks, and now not even able to tolerate liquids or medications.  Admitted to medicine, GI consult, provide IV medications necessary for blood pressure control hydration and her potassium.  * Hypokalemia Due to not eating enough, we will replace potassium IV  Check magnesium level.  * COPD  No active wheezing  continue DuoNeb nebulizer as needed.  * Hypertension  As she will not be able to tolerate oral we'll give hydralazine IV to maintain blood pressure.  * Gastric stricture and coffee-ground vomiting  Protonix IV twice a day. \ * Chronic pain  Given morphine IV.  * EKG changes  Patient has some T-wave inversions and ST depressions, this appears to be chronic compared to previous  We will follow troponin 2 more times to make sure.   case discussed with ED provider. Management plans discussed with the patient, family and they are in agreement.  CODE STATUS: Full    TOTAL TIME TAKING CARE OF THIS PATIENT: 50 minutes.    Vaughan Basta M.D on 08/22/2014   Between 7am to 6pm - Pager - [phone]  After 6pm go to [email or web address] - password [email or web address]  Office  [phone]  CC: Primary care physician; Kingsley Callander, MD

## 2014-09-09 NOTE — ED Notes (Signed)
Per EMS report, Patient c/o increased weakness, vomiting, spitting up dark brown mucous. Patient states symptoms have been present for an unknown amount of time, but have worsened since last night. Patient is a resident of [facility] and was given Phenergan at 0600 today for vomiting.

## 2014-09-10 ENCOUNTER — Encounter: Admission: EM | Disposition: E | Payer: Self-pay | Source: Home / Self Care | Attending: Internal Medicine

## 2014-09-10 ENCOUNTER — Encounter: Payer: Self-pay | Admitting: Anesthesiology

## 2014-09-10 ENCOUNTER — Inpatient Hospital Stay: Payer: Medicare Other | Admitting: Anesthesiology

## 2014-09-10 HISTORY — PX: ESOPHAGOGASTRODUODENOSCOPY (EGD) WITH PROPOFOL: SHX5813

## 2014-09-10 LAB — CBC
HEMATOCRIT: 38.6 % (ref 35.0–47.0)
HEMOGLOBIN: 12.2 g/dL (ref 12.0–16.0)
MCH: 25.3 pg — ABNORMAL LOW (ref 26.0–34.0)
MCHC: 31.5 g/dL — ABNORMAL LOW (ref 32.0–36.0)
MCV: 80.4 fL (ref 80.0–100.0)
Platelets: 182 10*3/uL (ref 150–440)
RBC: 4.8 MIL/uL (ref 3.80–5.20)
RDW: 20.4 % — ABNORMAL HIGH (ref 11.5–14.5)
WBC: 11.4 10*3/uL — AB (ref 3.6–11.0)

## 2014-09-10 LAB — BASIC METABOLIC PANEL
ANION GAP: 10 (ref 5–15)
BUN: 28 mg/dL — ABNORMAL HIGH (ref 6–20)
CHLORIDE: 107 mmol/L (ref 101–111)
CO2: 27 mmol/L (ref 22–32)
Calcium: 7.9 mg/dL — ABNORMAL LOW (ref 8.9–10.3)
Creatinine, Ser: 0.69 mg/dL (ref 0.44–1.00)
GFR calc non Af Amer: >60 mL/min (ref >60)
Glucose, Bld: 103 mg/dL — ABNORMAL HIGH (ref 65–99)
Potassium: 3.5 mmol/L (ref 3.5–5.1)
SODIUM: 144 mmol/L (ref 135–145)

## 2014-09-10 LAB — TROPONIN I
TROPONIN I: 0.04 ng/mL — AB (ref <0.031)
TROPONIN I: 0.03 ng/mL (ref <0.031)

## 2014-09-10 LAB — MRSA PCR SCREENING: MRSA by PCR: NEGATIVE

## 2014-09-10 SURGERY — EGD (ESOPHAGOGASTRODUODENOSCOPY)
Anesthesia: Monitor Anesthesia Care

## 2014-09-10 SURGERY — ESOPHAGOGASTRODUODENOSCOPY (EGD) WITH PROPOFOL
Anesthesia: General | Laterality: Left

## 2014-09-10 MED ORDER — SODIUM CHLORIDE 0.9 % IJ SOLN
INTRAMUSCULAR | Status: AC
  Administered 2014-09-10: 10 mL
  Filled 2014-09-10: qty 10

## 2014-09-10 MED ORDER — SODIUM CHLORIDE 0.9 % IV SOLN
INTRAVENOUS | Status: DC
  Administered 2014-09-10: 11:00:00 via INTRAVENOUS

## 2014-09-10 MED ORDER — PROMETHAZINE HCL 25 MG PO TABS
12.5000 mg | ORAL_TABLET | Freq: Four times a day (QID) | ORAL | Status: DC
  Administered 2014-09-10 – 2014-09-11 (×2): 12.5 mg via ORAL
  Filled 2014-09-10 (×8): qty 1

## 2014-09-10 MED ORDER — NITROFURANTOIN MONOHYD MACRO 100 MG PO CAPS
100.0000 mg | ORAL_CAPSULE | Freq: Two times a day (BID) | ORAL | Status: DC
  Filled 2014-09-10 (×3): qty 1

## 2014-09-10 MED ORDER — PROPOFOL INFUSION 10 MG/ML OPTIME
INTRAVENOUS | Status: DC | PRN
  Administered 2014-09-10: 100 ug/kg/min via INTRAVENOUS

## 2014-09-10 MED ORDER — IPRATROPIUM-ALBUTEROL 0.5-2.5 (3) MG/3ML IN SOLN
3.0000 mL | Freq: Once | RESPIRATORY_TRACT | Status: AC
  Administered 2014-09-10: 3 mL via RESPIRATORY_TRACT

## 2014-09-10 MED ORDER — SCOPOLAMINE 1 MG/3DAYS TD PT72
1.0000 | MEDICATED_PATCH | TRANSDERMAL | Status: DC
  Administered 2014-09-10: 1.5 mg via TRANSDERMAL
  Filled 2014-09-10 (×2): qty 1

## 2014-09-10 MED ORDER — LEVOTHYROXINE SODIUM 50 MCG PO TABS: 50.0000 ug | ORAL_TABLET | ORAL | Status: DC

## 2014-09-10 MED ORDER — BOOST / RESOURCE BREEZE PO LIQD
1.0000 | Freq: Three times a day (TID) | ORAL | Status: DC
  Administered 2014-09-10 – 2014-09-11 (×2): 1 via ORAL

## 2014-09-10 MED ORDER — CLONAZEPAM 0.5 MG PO TABS: 0.2500 mg | ORAL_TABLET | Freq: Two times a day (BID) | ORAL | Status: DC

## 2014-09-10 MED ORDER — PROPOFOL 10 MG/ML IV BOLUS
INTRAVENOUS | Status: DC | PRN
  Administered 2014-09-10: 30 mg via INTRAVENOUS

## 2014-09-10 MED ORDER — NYSTATIN 100000 UNIT/ML MT SUSP
5.0000 mL | Freq: Four times a day (QID) | OROMUCOSAL | Status: DC
  Administered 2014-09-10 – 2014-09-13 (×6): 500000 [IU] via ORAL
  Filled 2014-09-10 (×14): qty 5

## 2014-09-10 MED ORDER — OXYCODONE HCL 5 MG PO TABS: 2.5000 mg | ORAL_TABLET | ORAL | Status: DC | PRN

## 2014-09-10 MED ORDER — MECLIZINE HCL 25 MG PO TABS: 50.0000 mg | ORAL_TABLET | Freq: Three times a day (TID) | ORAL | Status: DC

## 2014-09-10 MED ORDER — SODIUM CHLORIDE 0.9 % IV SOLN: INTRAVENOUS | Status: DC

## 2014-09-10 MED ORDER — POTASSIUM CHLORIDE 20 MEQ PO PACK: 20.0000 meq | PACK | Freq: Every day | ORAL | Status: DC

## 2014-09-10 MED ORDER — PROMETHAZINE HCL 25 MG/ML IJ SOLN
12.5000 mg | Freq: Four times a day (QID) | INTRAMUSCULAR | Status: DC | PRN
Start: 2014-09-10 — End: 2014-09-13
  Administered 2014-09-10 – 2014-09-12 (×4): 12.5 mg via INTRAVENOUS
  Filled 2014-09-10 (×4): qty 1

## 2014-09-10 MED ORDER — PROMETHAZINE HCL 25 MG/ML IJ SOLN: 25.0000 mg | Freq: Four times a day (QID) | INTRAMUSCULAR | Status: DC | PRN

## 2014-09-10 NOTE — Consult Note (Signed)
GI Inpatient Consult Note  Reason for Consult: Stricture/ coffee ground emesis   Attending Requesting Consult:  Dr. Anselm Jungling  History of Present Illness: Tricia Miller is a 81 y.o. female With a past history of COPD, recent pneumonia, history of AFib, history of Barrett's esophagus.   History was given by her daughter Tricia Miller over the phone, patient is very lethargic and does not respond to questions.  Ms. Tricia Miller was hospitalized at the beginning of July for COPD exacerbation.  At that time she reported she was having dysphagia.  She was consulted by Dr. Dorothey Baseman office, who requested a barium swallow.  The barium study showed a high-grade distal esophageal stricture although small amount of barium did pass through the area.  They did not continue with the barium tablet.  Due to her condition at that time they had  Recommended that her respiratory status improve prior to an EGD with  Dilation be performed.  The patient is staying at a rehab center.  Her daughter reports that since she has been back from the hospital in the rehab center that her dysphagia has become even worse.  She is unable to swallow liquids.  She is vomiting a lot of the items back up, sometimes undigested food.  She has started to vomit a brown colored liquid.  Her daughter reports that her dysphagia started a couple of months ago.  She reports her mother has grown increasingly weak and is unable to participate in any physical therapy.  She still requires 2 L of oxygen.  When they tried to wean her off of the oxygen her stats will drop into the 80% range.  She is currently 94% on 2 L of oxygen.  Her daughter reports that she has taken Motrin 800 for many years for severe joint pain.  She had an episode of AFib and was started on a heart medication and taken off of the Motrin.  She did not react well to the heart medication, is currently not in AFib, is no longer on the heart medication.  She was allowed to go back on Motrin  800. At the rehab center they have a switch to to a different type of pain medication instead of the Motrin 800. She does take a PPI daily.  She has a history of Barrett's esophagus diagnosed several years ago at [facility] with an upper endoscopy.  I am unable to locate the record.  She had a barium swallow completed in 2015   At [facility].   EXAMDarcella Gasman SWALLOW DATE: 12/21/13 08:45:46 ACCESSION: [id number] UN DICTATED: 12/21/13 09:04:14 INTERPRETATION LOCATION: [place]  CLINICAL INDICATION: 81 Year Old (F):  dysphagia hx of barretts   TECHNIQUE: Single contrast barium swallow performed. The patient was given oral contrast to swallow and observed fluoroscopically.  Videofluoroscopy and spot films of the thoracic esophagus were obtained.  Fluoroscopy time was 4.06 minutes using a low dose system with pulsed fluoroscopy at 7.5 frames per second.   COMPARISON: None.  FINDINGS:  The patient was not able to stand or tolerate double contrast study secondary to nausea and weakness. The patient was given oral contrast to swallow which the patient did without difficulty.   Fluoroscopy and films of the thoracic esophagus in single contrast phase showed moderate dysmotility with slow passage of contrast, mild tertiary waves, and retrograde contrast flow. The contrast passage is seen in conjunction with diaphragmatic movements at the supine position. With repeated swallowing contrast passed fully into the stomach.  Small hiatal  hernia. No strictures visualized. No gastroesophageal reflux was noted or elicited with cough, and valsalva maneuvers.         IMPRESSION:  Limited study for the evaluation of Barret's esophagus secondary to patient weakness and nausea. Moderate esophageal dysmotility with mild tertiary waves and retrograde contrast movement in the esophagus. This may be secondary to presbyesophagus or early dysmotility disorder. Small hiatal hernia.  For evaluation of Barretts esophagus,  recommend endoscopy as patient could not tolerate double contrast barium swallow.   She had a barium swallow completed on August 22, 2014 ; Barium Swallow 08/22/2014  CLINICAL DATA: Dysphagia to solids some liquids  EXAM: ESOPHOGRAM/BARIUM SWALLOW  TECHNIQUE: Single contrast examination was performed using thin barium. The study was limited to having the patient in the supine and semi upright positions due to her clinical status.  FLUOROSCOPY TIME: Fluoroscopy Time: 1 minutes, 30 seconds  Number of Acquired Images: 19  COMPARISON: Chest x-ray of August 21, 2014  FINDINGS: The patient ingested the thin barium without difficulty. No laryngeal penetration of the barium was observed. The cervical and proximal and mid esophageal function was normal. However, at the level of the distal esophagus the barium did not immediately passed. With moving the patient in the semi upright position a small amount passed into the stomach. Normal distension of the distal esophagus and GE junction was never observed. A barium tablet was not administered.  IMPRESSION: 1. The study is limited due to the inability to have the patient in the full upright position. There is relatively high-grade distal esophageal obstruction or stricture. A small amount of barium did pass. Direct visualization is recommended. 2. The cervical and proximal and mid thoracic esophagus are unremarkable.   Electronically Signed  By: David Martinique M.D.  On: 08/22/2014 09:28     Past Medical History:  Past Medical History  Diagnosis Date  . COPD (chronic obstructive pulmonary disease)   . Vertigo   . Barrett esophagus   . Lymphoma of lymph nodes   . A-fib   . Pituitary macroadenoma     Problem List: Patient Active Problem List   Diagnosis Date Noted  . Nausea and vomiting 08/29/2014  . Dehydration 09/15/2014  . Dysphagia     Past Surgical History: Past Surgical History  Procedure Laterality  Date  . Abdominal hysterectomy    . Pituitary surgery    . Orthopedic surgery      Allergies: Allergies  Allergen Reactions  . Bactrim [Sulfamethoxazole-Trimethoprim] Swelling  . Ciprofloxacin Swelling  . Pregabalin Swelling  . Felodipine     Other reaction(s): UNKNOWN  . Levaquin [Levofloxacin In D5w] Other (See Comments)    Unknown  . Trimethoprim Other (See Comments)    UNKNOWN   . Codeine Nausea Only  . Morphine Nausea Only  . Penicillins Rash    Home Medications: Prescriptions prior to admission  Medication Sig Dispense Refill Last Dose  . acetaminophen (TYLENOL) 325 MG tablet Take 2 tablets (650 mg total) by mouth every 6 (six) hours as needed for mild pain (or Fever >/= 101).   unknown  . albuterol (PROVENTIL HFA;VENTOLIN HFA) 108 (90 BASE) MCG/ACT inhaler Inhale 2 puffs into the lungs every 4 (four) hours as needed for wheezing or shortness of breath.   unknown  . albuterol (PROVENTIL) (2.5 MG/3ML) 0.083% nebulizer solution Take 2.5 mg by nebulization every 4 (four) hours as needed for wheezing or shortness of breath.   unknown  . bisacodyl (DULCOLAX) 5 MG EC tablet Take 2  tablets (10 mg total) by mouth daily. 30 tablet 0 unknown  . clonazePAM (KLONOPIN) 0.5 MG tablet Take 0.25 mg by mouth every 12 (twelve) hours.   unknown  . diclofenac sodium (VOLTAREN) 1 % GEL Apply 2 g topically 4 (four) times daily.   unknown  . fluticasone (FLONASE) 50 MCG/ACT nasal spray Place 2 sprays into both nostrils daily.   unknown  . furosemide (LASIX) 20 MG tablet Take 20 mg by mouth daily as needed for edema.   unknown  . ipratropium-albuterol (DUONEB) 0.5-2.5 (3) MG/3ML SOLN Take 3 mLs by nebulization every 6 (six) hours as needed. 360 mL 1 unknown  . levothyroxine (SYNTHROID, LEVOTHROID) 50 MCG tablet Take 50 mcg by mouth every morning.   unknown  . magnesium oxide (MAG-OX) 400 MG tablet Take 400 mg by mouth daily.   unknown  . meclizine (ANTIVERT) 25 MG tablet Take 50 mg by mouth 3  (three) times daily.   unknown  . nitrofurantoin, macrocrystal-monohydrate, (MACROBID) 100 MG capsule Take 100 mg by mouth every 12 (twelve) hours.   unknown  . nystatin (MYCOSTATIN) 100000 UNIT/ML suspension Take 5 mLs by mouth 4 (four) times daily.   unknown  . omeprazole (PRILOSEC) 40 MG capsule Take 40 mg by mouth every morning.   unknown  . OxyCODONE HCl, Abuse Deter, (OXAYDO) 5 MG TABA Take 2.5 mg by mouth every 4 (four) hours as needed (for pain.).   unknown  . potassium chloride (KLOR-CON) 20 MEQ packet Take 20 mEq by mouth daily.   unknown  . promethazine (PHENERGAN) 12.5 MG tablet Take 12.5 mg by mouth 4 (four) times daily.   unknown  . promethazine (PHENERGAN) 25 MG/ML injection Inject 25 mg into the vein every 6 (six) hours as needed for nausea or vomiting.   unknown  . scopolamine (TRANSDERM-SCOP, 1.5 MG,) 1 MG/3DAYS Place 1 patch onto the skin every 3 (three) days.   unknown  . sertraline (ZOLOFT) 100 MG tablet Take 100 mg by mouth daily.    unknown  . triamcinolone (KENALOG) 0.025 % cream Apply 1 application topically 2 (two) times daily.   unknown  . potassium chloride (KLOR-CON) 20 MEQ packet Take 20 mEq by mouth daily. 10 packet 0    Home medication reconciliation was completed with the patient.   Scheduled Inpatient Medications:   . pantoprazole (PROTONIX) IV  40 mg Intravenous Q12H    Continuous Inpatient Infusions:   . dextrose 5 % and 0.45 % NaCl with KCl 40 mEq/L 75 mL/hr at 08/26/2014 2143    PRN Inpatient Medications:  hydrALAZINE, ipratropium-albuterol, morphine injection, ondansetron (ZOFRAN) IV, sodium chloride  Family History: family history includes Coronary artery disease in her mother; Hypertension in her father; Stroke in her father. There is no history of Diabetes, Colon cancer, or Liver disease.  The patient's family history is negative for inflammatory bowel disorders, GI malignancy, or solid organ transplantation.  Social History:   reports that she  has quit smoking. She does not have any smokeless tobacco history on file. She reports that she does not drink alcohol or use illicit drugs. The patient denies ETOH, tobacco, or drug use.   Review of Systems: Constitutional: Weight is stable.  Eyes: No changes in vision. ENT: No oral lesions, sore throat.  GI: see HPI.  Heme/Lymph: No easy bruising.  CV: No chest pain.  GU: No hematuria.  Integumentary: No rashes.  Neuro: No headaches.  Psych: No depression/anxiety.  Endocrine: No heat/cold intolerance.  Allergic/Immunologic: No urticaria.  Resp: No cough, SOB.  Musculoskeletal: No joint swelling.    Physical Examination: BP 108/59 mmHg  Pulse 87  Temp(Src) 97.4 F (36.3 C) (Oral)  Resp 18  Ht 4\' 11"  (1.499 m)  Wt 79.561 kg (175 lb 6.4 oz)  BMI 35.41 kg/m2  SpO2 99% Gen: NAD, alert and oriented x 4 HEENT: PEERLA, EOMI, Neck: supple, no JVD or thyromegaly Chest: CTA bilaterally, no wheezes, crackles, or other adventitious sounds CV: RRR, no m/g/c/r Abd: soft, NT, ND, +BS in all four quadrants; no HSM, guarding, ridigity, or rebound tenderness Ext: no edema, well perfused with 2+ pulses, Skin: no rash or lesions noted Lymph: no LAD  Data: Lab Results  Component Value Date   WBC 11.4* 09/09/2014   HGB 12.2 09/15/2014   HCT 38.6 08/20/2014   MCV 80.4 08/25/2014   PLT 182 08/31/2014    Recent Labs Lab 08/28/2014 1451 09/12/2014 0440  HGB 12.8 12.2   Lab Results  Component Value Date   NA 144 08/18/2014   K 3.5 08/28/2014   CL 107 08/23/2014   CO2 27 08/25/2014   BUN 28* 08/18/2014   CREATININE 0.69 08/22/2014   Lab Results  Component Value Date   ALT 12* 08/31/2014   AST 26 09/16/2014   ALKPHOS 101 08/29/2014   BILITOT 0.8 09/11/2014   No results for input(s): APTT, INR, PTT in the last 168 hours.   Imaging: CLINICAL DATA: Vomiting weakness and productive cough. Worsened since last night.  EXAM: CT ABDOMEN AND PELVIS WITH  CONTRAST  TECHNIQUE: Multidetector CT imaging of the abdomen and pelvis was performed using the standard protocol following bolus administration of intravenous contrast.  CONTRAST: 162mL OMNIPAQUE IOHEXOL 300 MG/ML SOLN  COMPARISON: Radiographs 08/20/2014  FINDINGS: Lower chest: No significant abnormality. Minor linear basilar scarring.  Hepatobiliary: There are multiple calculi within the gallbladder lumen. There is no gallbladder mural thickening or pericholecystic inflammatory change. There are multiple benign hepatic cysts measuring up to 2.7 cm. There is no significant liver lesion. There is no bile duct dilatation.  Pancreas: Fatty replaced. No pancreatic mass or ductal dilatation. No inflammatory changes.  Spleen: Normal  Adrenals/Urinary Tract: Benign left adrenal nodule containing fat and calcium consistent with a 1.8 cm myelolipoma. There are large benign renal cysts bilaterally, measuring up to 9.5 cm on the left and 7.3 cm on the right. Ureters and urinary bladder appear unremarkable.  Stomach/Bowel: There is a large hiatal hernia. The stomach, small bowel and colon are otherwise remarkable only for minimal uncomplicated colonic diverticulosis.  Vascular/Lymphatic: The abdominal aorta is normal in caliber. There is moderate atherosclerotic calcification. There is no adenopathy in the abdomen or pelvis.  Reproductive: Hysterectomy. No adnexal abnormalities.  Other: No acute inflammatory changes are evident in the abdomen or pelvis. There is no ascites.  Musculoskeletal: Multiple vertebral hemangiomata. No significant musculoskeletal lesion. Small fat containing inguinal hernias incidentally noted bilaterally.  IMPRESSION: 1. No acute findings. 2. Cholelithiasis 3. Benign fat containing left adrenal nodule 4. Large hiatal hernia 5. Benign cysts of the liver and kidneys.   Electronically Signed  By: Andreas Newport M.D.  On:  08/17/2014 17:11  CLINICAL DATA: Vomiting for 4 days, worse last night. Left side chest pain.  EXAM: DG ABDOMEN ACUTE W/ 1V CHEST  COMPARISON: PA and lateral chest 08/21/2014. CT chest 08/13/2014.  FINDINGS: Single view of the chest demonstrates clear lungs and normal heart size. No pneumothorax or pleural effusion is identified.  Kidneys of the abdomen show no  free intraperitoneal air. The bowel gas pattern is nonobstructive. Atherosclerotic vascular disease is noted.  IMPRESSION: No acute finding chest or abdomen.   Electronically Signed  By: Inge Rise M.D.  On: 08/27/2014 14:15  Old record  Assessment/Plan: Ms. Orama is a 81 y.o. female   Recommendations: We will proceed with upper EGD with possible dilation this afternoon.  Dr. Vira Agar is contacting daughters to discuss.   We will continue to follow with you. Thank you for the consult. Please call with questions or concerns.  Salvadore Farber, PA-C  I personally performed these services.

## 2014-09-10 NOTE — Progress Notes (Signed)
Report received from Endo RN. Patient back in rm 255.

## 2014-09-10 NOTE — Progress Notes (Signed)
Notified Dr.Konidena about 3rd troponin level .04, requested another troponin level to be drawn in 4 hours.

## 2014-09-10 NOTE — Transfer of Care (Signed)
Immediate Anesthesia Transfer of Care Note  Patient: Tricia Miller  Procedure(s) Performed: Procedure(s): ESOPHAGOGASTRODUODENOSCOPY (EGD) WITH PROPOFOL with possible dilation (Left)  Patient Location: PACU and Endoscopy Unit  Anesthesia Type:General  Level of Consciousness: sedated  Airway & Oxygen Therapy: Patient Spontanous Breathing and Patient connected to nasal cannula oxygen  Post-op Assessment: Report given to RN and Post -op Vital signs reviewed and stable  Post vital signs: Reviewed and stable  Last Vitals:  Filed Vitals:   09/03/2014 1702  BP: 117/67  Pulse: 85  Temp: 36.3 C  Resp: 10    Complications: No apparent anesthesia complications

## 2014-09-10 NOTE — Progress Notes (Signed)
Per endoscopy and Dr. Vira Agar, okay to give IV zofran early, hold off on IV phenergen pre-EGD if possible, but if no relief from n/v than give phenergan as ordered.

## 2014-09-10 NOTE — Anesthesia Preprocedure Evaluation (Addendum)
Anesthesia Evaluation  Patient identified by MRN, date of birth, ID band Patient awake    Reviewed: Allergy & Precautions, H&P , NPO status , Patient's Chart, lab work & pertinent test results, reviewed documented beta blocker date and time   Airway Mallampati: III  TM Distance: >3 FB Neck ROM: limited    Dental  (+) Edentulous Upper, Edentulous Lower, Poor Dentition   Pulmonary COPD COPD inhaler and oxygen dependent, former smoker,  breath sounds clear to auscultation  Pulmonary exam normal       Cardiovascular Exercise Tolerance: Poor - Past MI + dysrhythmias Atrial Fibrillation Rhythm:irregular Rate:Normal     Neuro/Psych negative neurological ROS  negative psych ROS   GI/Hepatic Neg liver ROS, GERD-  Controlled,  Endo/Other  negative endocrine ROS  Renal/GU negative Renal ROS  negative genitourinary   Musculoskeletal   Abdominal   Peds  Hematology negative hematology ROS (+)   Anesthesia Other Findings Past Medical History:   COPD (chronic obstructive pulmonary disease)                 Vertigo                                                      Barrett esophagus                                            Lymphoma of lymph nodes                                      A-fib                                                        Pituitary macroadenoma                                       Reproductive/Obstetrics negative OB ROS                            Anesthesia Physical Anesthesia Plan  ASA: IV  Anesthesia Plan: General   Post-op Pain Management:    Induction:   Airway Management Planned:   Additional Equipment:   Intra-op Plan:   Post-operative Plan:   Informed Consent: I have reviewed the patients History and Physical, chart, labs and discussed the procedure including the risks, benefits and alternatives for the proposed anesthesia with the patient or authorized  representative who has indicated his/her understanding and acceptance.   Dental Advisory Given  Plan Discussed with: Anesthesiologist, CRNA and Surgeon  Anesthesia Plan Comments: (Daughter and patient consented.  Patient has concerns about intubation leading to prolonged intubation 2/2 her COPD.  No vomiting since 7/23 per daughter and patient.  NPO appropriate.  Plan to attempt procedure with natural airway as patient had report of difficult extubation with previous procedure requiring prolonged ICU  intubation 2/2 COPD.  Aware of esophageal stricture.  At this time we feel that the risk of prolonged intubation outweighs the risk of aspiration.  These concerns and risk were discussed with the patient and the patients daughter and they both agree with out plan.)       Anesthesia Quick Evaluation

## 2014-09-10 NOTE — Progress Notes (Signed)
Received verbal telephone order from Dr. Vianne Bulls for IV phenergan for patients unrelieved nausea.

## 2014-09-10 NOTE — Progress Notes (Signed)
PT Cancellation Note  Patient Details Name: Ceana Fiala MRN: [id number] DOB: 1929-10-27   Cancelled Treatment:    Reason Eval/Treat Not Completed: Fatigue/lethargy limiting ability to participate (Pt declining PT d/t fatigue.)  Will re-attempt PT eval at a later date/time as appropriate and as able.   Raquel Sarna Sola Margolis 09/05/2014, 9:04 AM Leitha Bleak, [place]

## 2014-09-10 NOTE — Op Note (Signed)
[facility] Patient Name: Tricia Miller Procedure Date: 09/05/2014 4:22 PM MRN: [id number] Account #: [id number] Date of Birth: 1929/10/28 Admit Type: Outpatient Age: 81 Room: [facility] ENDO ROOM 1 Gender: Female Note Status: Finalized Procedure:         Upper GI endoscopy Indications:       Abnormal CT of the GI tract Providers:         Manya Silvas, MD Referring MD:      Kingsley Callander (Referring MD) Medicines:         Propofol per Anesthesia Complications:     No immediate complications. Procedure:         Pre-Anesthesia Assessment:                    - After reviewing the risks and benefits, the patient was                     deemed in satisfactory condition to undergo the procedure.                    After obtaining informed consent, the endoscope was passed                     under direct vision. Throughout the procedure, the                     patient's blood pressure, pulse, and oxygen saturations                     were monitored continuously. The Endoscope was introduced                     through the mouth, and advanced to the second part of                     duodenum. The upper GI endoscopy was accomplished without                     difficulty. The patient tolerated the procedure well. Findings:      LA Grade C (one or more mucosal breaks continuous between tops of 2 or       more mucosal folds, less than 75% circumference) esophagitis with no       bleeding was found 30 cm from the incisors. There was a few spots of       fresh blood.      There were esophageal mucosal changes secondary to established       short-segment Barrett's disease present in the lower third of the       esophagus. The maximum longitudinal extent of these mucosal changes was       1- 2 cm in length. No masses or ulcerations seen.      A large hiatus hernia was present. 6cm.      A large, fungating, non-circumferential mass with no bleeding  and no       stigmata of recent bleeding was found in the gastric antrum. Likly       benign but Biopsies were taken with a cold forceps for histology.      There was a large lipoma, 30 mm in diameter, in the second part of the       duodenum. Biopsies were taken with a cold forceps for histology. Impression:        -  LA Grade C reflux esophagitis.                    - Esophageal mucosal changes secondary to established                     short-segment Barrett's disease.                    - Large hiatus hernia.                    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                     Biopsied.                    - Duodenal lipoma. Biopsied. Recommendation:    - Await pathology results. Carafate slurry and PPI Manya Silvas, MD 09/07/2014 5:04:51 PM This report has been signed electronically. Number of Addenda: 0 Note Initiated On: 09/09/2014 4:22 PM      [facility]

## 2014-09-10 NOTE — Clinical Social Work Note (Signed)
Clinical Social Work Assessment  Patient Details  Name: Tricia Miller MRN: [id number] Date of Birth: 1929/12/23  Date of referral:  09/05/2014               Reason for consult:  Facility Placement (pt is from [facility])                Permission sought to share information with:  Family Supports, Chartered certified accountant granted to share information::  Yes, Verbal Permission Granted  Name::        Agency::     Relationship::     Contact Information:     Housing/Transportation Living arrangements for the past 2 months:  [place], [place] of Information:  Patient, Medical Team, Adult Children Patient Interpreter Needed:  None Criminal Activity/Legal Involvement Pertinent to Current Situation/Hospitalization:  No - Comment as needed Significant Relationships:  Adult Children Lives with:  Facility Resident, Adult Children Do you feel safe going back to the place where you live?  Yes Need for family participation in patient care:     Care giving concerns:  Pt was concerned about how long she was at the facility.  CSW suggested that she speak to the SNF facility to determine if she wanted to continue staying with this facility.    Social Worker assessment / plan:  CSW spoke to pt and pt's daughter in the pt's room.  Both were in agreemenet with returning to [facility].  Pt's daughter was concerned about her constantly getting sick and not being able to participate in rehab because of her constantly feeling ill.  CSW directed them to the RN and MD for medical questions. CSW will continue to follow.  Employment status:  Retired Forensic scientist:  Medicare PT Recommendations:    Information / Referral to community resources:     Patient/Family's Response to care:  Pt and pt's daughter were in agreement with DC back to [facility]  Patient/Family's Understanding of and Emotional Response to Diagnosis, Current Treatment,  and Prognosis:  Pt and pt's daughter were in agreement with DC back to [facility].  Both verbalized their understanding and agreement with this DC plan.  Emotional Assessment Appearance:  Appears stated age Attitude/Demeanor/Rapport:   (pt was not feeling well at time of assessment.  Unable to determine) Affect (typically observed):   (out of breath ) Orientation:  Oriented to Self, Oriented to Place, Oriented to  Time, Oriented to Situation Alcohol / Substance use:  Never Used Psych involvement (Current and /or in the community):  No (Comment)  Discharge Needs  Concerns to be addressed:  Care Coordination Readmission within the last 30 days:  Yes Current discharge risk:    Barriers to Discharge:  No Barriers Identified   Tricia Argyle, LCSW 09/04/2014, 3:42 PM

## 2014-09-10 NOTE — Anesthesia Postprocedure Evaluation (Signed)
  Anesthesia Post-op Note  Patient: Tricia Miller  Procedure(s) Performed: Procedure(s): ESOPHAGOGASTRODUODENOSCOPY (EGD) WITH PROPOFOL with possible dilation (Left)  Anesthesia type:General  Patient location: PACU  Post pain: Pain level controlled  Post assessment: Post-op Vital signs reviewed, Patient's Cardiovascular Status Stable, Respiratory Function Stable, Patent Airway and No signs of Nausea or vomiting  Post vital signs: Reviewed and stable  Last Vitals:  Filed Vitals:   09/06/2014 1730  BP: 137/77  Pulse: 84  Temp:   Resp: 11    Level of consciousness: awake, alert  and patient cooperative  Complications: No apparent anesthesia complications

## 2014-09-10 NOTE — Consult Note (Signed)
Palliative Care Update  Pt was in endoscopy lab, so will be seen by me tomorrow.    She is known to me for I was consulted to see her when she was here 2-3 weeks ago.    Last admission, she was a 'limited code' as DNI status.  Now, she has orders for full code status.  Will review this when able to talk with her/ family.    Colleen Can, MD

## 2014-09-10 NOTE — Consult Note (Signed)
See EGD note in chart.  Esophagitis, Barretts esophagus, large hiatal hernia, antral mass, likely benign, duodenal lipoma which could cause obstructive symptoms.  Bx done of antral mass, lipoma

## 2014-09-10 NOTE — Progress Notes (Signed)
[facility] [place] NAME: Tricia Miller    MR#:  [id number]  DATE OF BIRTH:  March 13, 1929  SUBJECTIVE: admitted secondary to nausea vomiting.  Intake. Patient does have esophageal stricture but could not have dilatation of stricture  to her respiratory status. Recently admitted on July 2 and discharged on July 8th  To [facility].  patient is admitted to telemetry for hypokalemia which is resolved now but she is lethargic and not able to answer question appropriately.   CHIEF COMPLAINT:   Chief Complaint  Patient presents with  . Weakness    REVIEW OF SYSTEMS:    Review of Systems  Unable to perform ROS: medical condition    Nutrition:  Tolerating Diet:no Tolerating PT:      DRUG ALLERGIES:   Allergies  Allergen Reactions  . Bactrim [Sulfamethoxazole-Trimethoprim] Swelling  . Ciprofloxacin Swelling  . Pregabalin Swelling  . Felodipine     Other reaction(s): UNKNOWN  . Levaquin [Levofloxacin In D5w] Other (See Comments)    Unknown  . Trimethoprim Other (See Comments)    UNKNOWN   . Codeine Nausea Only  . Morphine Nausea Only  . Penicillins Rash    VITALS:  Blood pressure 108/59, pulse 87, temperature 97.4 F (36.3 C), temperature source Oral, resp. rate 18, height 4\' 11"  (1.499 m), weight 79.561 kg (175 lb 6.4 oz), SpO2 99 %.  PHYSICAL EXAMINATION:   Physical Exam  GENERAL:  81 y.o.-year-old patient lying in the bed  YES: Pupils equal, round, reactive to light and accommodation. No scleral icterus. Extraocular muscles intact.  HEENT: Head atraumatic, normocephalic. Oropharynx and nasopharynx clear.  NECK:  Supple, no jugular venous distention. No thyroid enlargement, no tenderness.  LUNGS: Normal breath sounds bilaterally, no wheezing, rales,rhonchi or crepitation. No use of accessory muscles of respiration.  CARDIOVASCULAR: S1, S2 normal. No murmurs, rubs, or gallops.  ABDOMEN: Distended, tender to  palpation. Bowel Sounds .: No pedal edema, cyanosis, or clubbing.  NEUROLOGIC: Cranial nerves II through XII are intact. Muscle strength 5/5 in all extremities. Sensation intact. Gait not checked.  PSYCHIATRIC: The patient is alert and oriented x 3.  SKIN: No obvious rash, lesion, or ulcer.    LABORATORY PANEL:   CBC  Recent Labs Lab 08/29/2014 0440  WBC 11.4*  HGB 12.2  HCT 38.6  PLT 182   ------------------------------------------------------------------------------------------------------------------  Chemistries   Recent Labs Lab 08/28/2014 1451 09/05/2014 0440  NA 142 144  K 2.8* 3.5  CL 105 107  CO2 22 27  GLUCOSE 104* 103*  BUN 28* 28*  CREATININE 0.72 0.69  CALCIUM 8.0* 7.9*  MG 2.0  --   AST 26  --   ALT 12*  --   ALKPHOS 101  --   BILITOT 0.8  --    ------------------------------------------------------------------------------------------------------------------  Cardiac Enzymes  Recent Labs Lab 09/12/2014 0440  TROPONINI 0.03   ------------------------------------------------------------------------------------------------------------------  RADIOLOGY:  Ct Abdomen Pelvis W Contrast  09/16/2014   CLINICAL DATA:  Vomiting weakness and productive cough. Worsened since last night.  EXAM: CT ABDOMEN AND PELVIS WITH CONTRAST  TECHNIQUE: Multidetector CT imaging of the abdomen and pelvis was performed using the standard protocol following bolus administration of intravenous contrast.  CONTRAST:  184mL OMNIPAQUE IOHEXOL 300 MG/ML  SOLN  COMPARISON:  Radiographs 08/26/2014  FINDINGS: Lower chest: No significant abnormality. Minor linear basilar scarring.  Hepatobiliary: There are multiple calculi within the gallbladder lumen. There is no gallbladder mural thickening or pericholecystic inflammatory change.  There are multiple benign hepatic cysts measuring up to 2.7 cm. There is no significant liver lesion. There is no bile duct dilatation.  Pancreas: Fatty replaced. No  pancreatic mass or ductal dilatation. No inflammatory changes.  Spleen: Normal  Adrenals/Urinary Tract: Benign left adrenal nodule containing fat and calcium consistent with a 1.8 cm myelolipoma. There are large benign renal cysts bilaterally, measuring up to 9.5 cm on the left and 7.3 cm on the right. Ureters and urinary bladder appear unremarkable.  Stomach/Bowel: There is a large hiatal hernia. The stomach, small bowel and colon are otherwise remarkable only for minimal uncomplicated colonic diverticulosis.  Vascular/Lymphatic: The abdominal aorta is normal in caliber. There is moderate atherosclerotic calcification. There is no adenopathy in the abdomen or pelvis.  Reproductive: Hysterectomy.  No adnexal abnormalities.  Other: No acute inflammatory changes are evident in the abdomen or pelvis. There is no ascites.  Musculoskeletal: Multiple vertebral hemangiomata. No significant musculoskeletal lesion. Small fat containing inguinal hernias incidentally noted bilaterally.  IMPRESSION: 1. No acute findings. 2. Cholelithiasis 3. Benign fat containing left adrenal nodule 4. Large hiatal hernia 5. Benign cysts of the liver and kidneys.   Electronically Signed   By: Andreas Newport M.D.   On: 09/01/2014 17:11   Dg Abd Acute W/chest  08/22/2014   CLINICAL DATA:  Vomiting for 4 days, worse last night. Left side chest pain.  EXAM: DG ABDOMEN ACUTE W/ 1V CHEST  COMPARISON:  PA and lateral chest 08/21/2014.  CT chest 08/13/2014.  FINDINGS: Single view of the chest demonstrates clear lungs and normal heart size. No pneumothorax or pleural effusion is identified.  Kidneys of the abdomen show no free intraperitoneal air. The bowel gas pattern is nonobstructive. Atherosclerotic vascular disease is noted.  IMPRESSION: No acute finding chest or abdomen.   Electronically Signed   By: Inge Rise M.D.   On: 08/18/2014 14:15     ASSESSMENT AND PLAN:   Principal Problem:   Nausea and vomiting Active Problems:    Dysphagia   Dehydration  1.nausea.vomiting secondary to esophageal stricture;consult GI;continue iv fluids 2,h/o pituitary microadenoma;seen at [facility],FAMILY CHOSE NOT TO GO for RT  DUE TO SIDE EFFECTS. 3.COPD;STABLE; 4.LETHARGY DUE TO MEDICAL PROBLEM; continue IV fluids #5 nausea vomiting the coffee-ground vomiting likely of possible gastritis patient recently was taken on Xarelto. History of proximal A. fib controlled:  Hypokalemia secondary to nausea vomiting for by mouth intake continue IV fluids.  Palliative care consult requested secondary to multiple medical problems and  recurrent admissions and failure to thrive. Patient has seen  Dr. Megan Salon during the last admission.   All the records are reviewed and case discussed with Care Management/Social Workerr. Management plans discussed with the patient, family and they are in agreement.  CODE STATUS: full  TOTAL TIME TAKING CARE OF THIS PATIENT: 35 minutes.   POSSIBLE D/C IN 1-2  DAYS, DEPENDING ON CLINICAL CONDITION.   Epifanio Lesches M.D on 08/22/2014 at 11:00 AM  Between 7am to 6pm - Pager - [phone]  After 6pm go to [email or web address] - password [email or web address] [facility]  Office  [phone]  CC: Primary care physician; Kingsley Callander, MD

## 2014-09-10 NOTE — Progress Notes (Signed)
°   09/03/2014 0900  Clinical Encounter Type  Visited With Patient  Visit Type Initial  Referral From Nurse  Spiritual Encounters  Spiritual Needs Prayer

## 2014-09-10 NOTE — Anesthesia Procedure Notes (Signed)
Date/Time: 09/14/2014 4:37 PM Performed by: Doreen Salvage Pre-anesthesia Checklist: Patient identified, Emergency Drugs available, Suction available and Patient being monitored Patient Re-evaluated:Patient Re-evaluated prior to inductionOxygen Delivery Method: Nasal cannula

## 2014-09-11 ENCOUNTER — Encounter: Payer: Self-pay | Admitting: Unknown Physician Specialty

## 2014-09-11 DIAGNOSIS — K5900 Constipation, unspecified: Secondary | ICD-10-CM

## 2014-09-11 DIAGNOSIS — J449 Chronic obstructive pulmonary disease, unspecified: Secondary | ICD-10-CM

## 2014-09-11 DIAGNOSIS — Z515 Encounter for palliative care: Secondary | ICD-10-CM

## 2014-09-11 DIAGNOSIS — R1310 Dysphagia, unspecified: Secondary | ICD-10-CM

## 2014-09-11 DIAGNOSIS — J9600 Acute respiratory failure, unspecified whether with hypoxia or hypercapnia: Secondary | ICD-10-CM

## 2014-09-11 LAB — GLUCOSE, CAPILLARY: Glucose-Capillary: 134 mg/dL — ABNORMAL HIGH (ref 65–99)

## 2014-09-11 MED ORDER — FLUCONAZOLE 100MG IVPB
100.0000 mg | INTRAVENOUS | Status: DC
  Administered 2014-09-11 – 2014-09-13 (×3): 100 mg via INTRAVENOUS
  Filled 2014-09-11 (×5): qty 50

## 2014-09-11 MED ORDER — MORPHINE SULFATE 2 MG/ML IJ SOLN
1.0000 mg | INTRAMUSCULAR | Status: DC | PRN
  Administered 2014-09-11 (×2): 1 mg via INTRAVENOUS
  Filled 2014-09-11 (×2): qty 1

## 2014-09-11 MED ORDER — SUCRALFATE 1 GM/10ML PO SUSP
1.0000 g | Freq: Three times a day (TID) | ORAL | Status: DC
  Administered 2014-09-11 – 2014-09-12 (×2): 1 g via ORAL
  Filled 2014-09-11 (×10): qty 10

## 2014-09-11 MED ORDER — SODIUM CHLORIDE 0.9 % IJ SOLN
INTRAMUSCULAR | Status: AC
  Administered 2014-09-11: 21:00:00
  Filled 2014-09-11: qty 10

## 2014-09-11 MED ORDER — DEXTROSE-NACL 5-0.45 % IV SOLN
INTRAVENOUS | Status: DC
Start: 2014-09-11 — End: 2014-09-12
  Administered 2014-09-11 – 2014-09-12 (×2): via INTRAVENOUS

## 2014-09-11 MED ORDER — SODIUM CHLORIDE 0.9 % IJ SOLN
INTRAMUSCULAR | Status: AC
  Administered 2014-09-11: 15:00:00
  Filled 2014-09-11: qty 10

## 2014-09-11 NOTE — Consult Note (Signed)
Patient not wanting to swallow anything, her esophagus showed some inflammation and Prominent Barretts at the GEJ but nothing that would prevent things going down the esophagus.  Will get Speech pathology and a modified barium swallow.

## 2014-09-11 NOTE — Care Management Important Message (Signed)
Important Message  Patient Details  Name: Tricia Miller MRN: [id number] Date of Birth: 01/04/1930   Medicare Important Message Given:  Yes-second notification given    Juliann Pulse A Allmond 09/11/2014, 9:54 AM

## 2014-09-11 NOTE — Progress Notes (Signed)
Dr. Vianne Bulls notified that pt still unable to tolerate PO medication, excepting SL phenergan. MD to adjust medications to IV as necessary.

## 2014-09-11 NOTE — Evaluation (Signed)
Clinical/Bedside Swallow Evaluation Patient Details  Name: Kennede Lusk MRN: [id number] Date of Birth: 03/16/1929  Today's Date: 09/11/2014 Time: SLP Start Time (ACUTE ONLY): 1110 SLP Stop Time (ACUTE ONLY): 1210 SLP Time Calculation (min) (ACUTE ONLY): 60 min  Past Medical History:  Past Medical History  Diagnosis Date  . COPD (chronic obstructive pulmonary disease)   . Vertigo   . Barrett esophagus   . Lymphoma of lymph nodes   . A-fib   . Pituitary macroadenoma    Past Surgical History:  Past Surgical History  Procedure Laterality Date  . Abdominal hysterectomy    . Pituitary surgery    . Orthopedic surgery    . Esophagogastroduodenoscopy (egd) with propofol Left 08/21/2014    Procedure: ESOPHAGOGASTRODUODENOSCOPY (EGD) WITH PROPOFOL with possible dilation;  Surgeon: Manya Silvas, MD;  Location: [facility] END[facility];  Service: Endoscopy;  Laterality: Left;   HPI:  Pt is a 81 y.o. female with a known history of COPD, esophageal stricture, lymphoma, A. fib, pituitary macroadenoma was admitted to hospital 2 weeks ago for COPD at that time she had complain of dysphagia and barium swallow was done which showed she is his original stricture and she was advised to follow-up in GI clinic after discharge once her respiratory status improves. She was discharged to rehabilitation Center because of generalized weakness. In the last 2 weeks Rehab, pt was not able to eat enough; initially she was on antibiotic for her COPD and pneumonia on discharge - later the rehab doctor started her on some antibiotic for UTI also. They tried a different kind of diet. Patient was not able to eat anything and gradually became weaker so it was hard for her to participate with physical therapy. For last 2-3 days, she started vomiting with any kind of food or medication they tried to give her at Rehab, and on day of admission, the vomit was dark coffee-colored and she also had some diarrhea. Daughter dened any  fever. Patient was not able to take any pain medications and so she started having pain all over her body in her back, joints, shoulders and her abdomen. 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 Today, pt is still having gagging and c/o a severely sore throat when attempting to swallow pills w/ NSG; she begins dry-heaving following the gagging per NSG report. Pt is currently NPO.    Assessment / Plan / Recommendation Clinical Impression  Pt appeared to be able to swallow small amounts of liquid melted from ice chips and popcicle but was hampered by a severely sore throat which resulted in pt stating she "just could not swallow!" followed by gagging then coughing x1. Pt described that is "hurts so much when I try to swallow". This discomfort, talking, and coughing increased pt's respiratory effort. Pt was given time to calm her breathing. She continued to talk and describe many ailments and how she could not understand why she did not get better "this time". MD was present in the room; pt described to her the "pain" she had when she tried to swallow. MD changed her meds, including Diflucan, to IV and fluids started. Rec. no oral diet today and allow for meds to begin working, healing. Encouraged pt to take the ice chips as she could tolerate. ST will f/u tomorrow w/ further po trials. NSG present and agreed. MD agreed.  Aspiration Risk   (increased)    Diet Recommendation NPO (w/ ice chips as tolerates)   Medication Administration: Crushed with puree (rec. IV meds today) Compensations: Slow rate;Small sips/bites    Other  Recommendations Recommended Consults:  (continue GI f/u) Oral Care Recommendations: Oral care BID;Oral care before and after PO;Staff/trained caregiver to provide oral care Other Recommendations:  (Reflux precautions)   Follow Up  Recommendations       Frequency and Duration min 3x week  1 week   Pertinent Vitals/Pain Discomfort swallowing; NSG/MD present and aware    SLP Swallow Goals  see care plan   Swallow Study Prior Functional Status   pt resided at [facility] for Rehab since last admission. She became too weak to participate in tx per report; decreased oral intake then vomiting occurred.     General Date of Onset: 08/26/2014 Other Pertinent Information: Pt is a 81 y.o. female with a known history of COPD, esophageal stricture, lymphoma, A. fib, pituitary macroadenoma was admitted to hospital 2 weeks ago for COPD at that time she had complain of dysphagia and barium swallow was done which showed she is his original stricture and she was advised to follow-up in GI clinic after discharge once her respiratory status improves. She was discharged to rehabilitation Center because of generalized weakness. In the last 2 weeks Rehab, pt was not able to eat enough; initially she was on antibiotic for her COPD and pneumonia on discharge - later the rehab doctor started her on some antibiotic for UTI also. They tried a different kind of diet. Patient was not able to eat anything and gradually became weaker so it was hard for her to participate with physical therapy. For last 2-3 days, she started vomiting with any kind of food or medication they tried to give her at Rehab, and on day of admission, the vomit was dark coffee-colored and she also had some diarrhea. Daughter dened any fever. Patient was not able to take any pain medications and so she started having pain all over her body in her back, joints, shoulders and her abdomen. 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 Today, pt is still having gagging and c/o a severely sore throat when attempting to swallow pills w/ NSG; she begins dry-heaving  following the gagging per NSG report. Pt is currently NPO.  Type of Study: Bedside swallow evaluation Previous Swallow Assessment: GI assessment Diet Prior to this Study: Regular;Thin liquids (at home; pt stating she was able to eat "some foods" at home) Temperature Spikes Noted:  (WBC 11.4 on 7.25.16; Temp b/t 97-99 degrees) Respiratory Status: Supplemental O2 delivered via (comment) (2 liters) History of Recent Intubation: No Behavior/Cognition: Alert;Cooperative (appeared uncomfortable) Oral Cavity - Dentition: Edentulous Self-Feeding Abilities: Able to feed self;Needs assist;Needs set up Patient Positioning: Upright in bed Baseline Vocal Quality: Low vocal intensity Volitional Cough: Strong Volitional Swallow: Able to elicit    Oral/Motor/Sensory Function Overall Oral Motor/Sensory Function: Appears within functional limits for tasks assessed Labial ROM: Within Functional Limits Labial Symmetry: Within Functional Limits Labial Strength: Within Functional Limits Lingual ROM: Within Functional Limits Lingual Symmetry: Within Functional Limits Lingual Strength: Within Functional Limits Facial Symmetry: Within Functional Limits Mandible: Within Functional Limits   Ice Chips Ice chips: Within functional limits Presentation: Self Fed;Spoon (single chips x3) Other Comments: Pt then fed self 3-4 bites of a popcicle w/ no immediate  coughing, however, she had an immediate c/o severely sore throat followed by min. gagging then coughing x1. Nothing more was given.    Thin Liquid      Nectar Thick     Honey Thick     Puree     Solid   GO           Orinda Kenner, MS, CCC-SLP Watson,Katherine 09/11/2014,3:22 PM

## 2014-09-11 NOTE — Plan of Care (Signed)
Problem: Phase II Progression Outcomes Goal: Progress activity as tolerated unless otherwise ordered Outcome: Not Progressing Patient is refusing PT at this time

## 2014-09-11 NOTE — Progress Notes (Signed)
Initial Nutrition Assessment   INTERVENTION:   Meals and Snacks: Cater to patient preferences with current diet order of FL.  Medical Food Supplement Therapy: RD notes order for Boost Breeze TID (each supplement providing 250kcals and 9g protein). Pt had not had a supplement on visit today thus far therefore will assess intake/acceptance on follow. Coordination of Care: will continue to assess for malnutrition secondary to severe dysphagia and weight loss using guidelines from AND/[facility]  NUTRITION DIAGNOSIS:   Swallowing difficulty related to dysphagia as evidenced by per patient/family report.  GOAL:   Patient will meet greater than or equal to 90% of their needs  MONITOR:    (Energy Intake, Electrolyte and renal Profile, Digestive system)  REASON FOR ASSESSMENT:   Consult Poor PO  ASSESSMENT:   Pt admitted with severe dysphagia s/p EGD. Per MD note pt with esophagitis, Barrett's esophagus, Hiatal hernia, and antral mass likely benign.  Past Medical History  Diagnosis Date  . COPD (chronic obstructive pulmonary disease)   . Vertigo   . Barrett esophagus   . Lymphoma of lymph nodes   . A-fib   . Pituitary macroadenoma     Diet Order:  Diet full liquid Room service appropriate?: Yes; Fluid consistency:: Thin    Current Nutrition: Pt ate a few bites of popsicle this afternoon with SLP. SLP reports pt c/o burning.    Food/Nutrition-Related History: Pt reports, 'I don't know' when asked about appetite PTA. Pt groggy on visit, per RN Maddie pt had been given phenergan earlier. RD notes on last admission one month ago pt had reported poor po intake secondary to dysphagia and pt was eating <50% of meals on that admission. Per H&P, pts daughter had reported poor po intake for the past 2 weeks with dark emesis the last 2 days.  Medications: Diflucan, Protonix, Carafate  Electrolyte/Renal Profile and Glucose Profile:   Recent Labs Lab 08/26/2014 1451 08/21/2014 0440  NA 142  144  K 2.8* 3.5  CL 105 107  CO2 22 27  BUN 28* 28*  CREATININE 0.72 0.69  CALCIUM 8.0* 7.9*  MG 2.0  --   GLUCOSE 104* 103*   Protein Profile:  Recent Labs Lab 09/10/2014 1451  ALBUMIN 3.1*    Gastrointestinal Profile: distended abdomen, hypoactive BS, tender abdomen per Nsg Last BM:  08/17/2014   Nutrition-Focused Physical Exam Findings: Nutrition-Focused physical exam completed. Findings are WDL for fat depletion, with some mild muscle depletion of temples only, and no edema; however RD unable to assess lower extremities.    Weight Change: Per CHL pt with 17% weight loss in 1 month   Height:   Ht Readings from Last 1 Encounters:  09/12/2014 4\' 11"  (1.499 m)    Weight:   Wt Readings from Last 1 Encounters:  09/11/2014 175 lb 6.4 oz (79.561 kg)    Ideal Body Weight:   44.5kg  Wt Readings from Last 10 Encounters:  09/07/2014 175 lb 6.4 oz (79.561 kg)  08/19/14 195 lb 4.8 oz (88.587 kg)  08/21/14 195 lb (88.451 kg)  08/13/14 212 lb 15.4 oz (96.599 kg)    BMI:  Body mass index is 35.41 kg/(m^2).  Estimated Nutritional Needs:   Kcal:  1160-1369kcals, BEE: 810kcals, TEE: (IF1.1-1.3)(AF 1.3) using IBW of 44.5kg  Protein:  45-54g protein (1.0-1.2g/kg) using IBW of 44.5kg  Fluid:  1113-1314mL of fluid (25-47mL/kg) using IBW of 44.5kg  EDUCATION NEEDS:   Education needs no appropriate at this time  [place],  RD, LDN Pager [phone]

## 2014-09-11 NOTE — Progress Notes (Signed)
PT Cancellation Note  Patient Details Name: Tricia Miller MRN: [id number] DOB: 1929/08/06   Cancelled Treatment:    Reason Eval/Treat Not Completed: Patient declined, no reason specified.  New PT order received.  Nursing spoke with pt and reports that pt is refusing PT at this time.  Will re-attempt PT at a later date/time as medically appropriate and as able.   Raquel Sarna Kariann Wecker 09/11/2014, 10:18 AM Leitha Bleak, [place]

## 2014-09-11 NOTE — Progress Notes (Addendum)
PT Cancellation Note  Patient Details Name: Tricia Miller MRN: [id number] DOB: January 07, 1930   Cancelled Treatment:    Reason Eval/Treat Not Completed: Other (comment) (Pt s/p upper GI endoscopy 09/07/2014; d/t pt undergoing general anesthesia for this procedure, per protocol will require new PT orders (nursing notified).)  Will complete PT orders at this time.  Please re-consult PT when pt is medically appropriate to participate in PT.   Raquel Sarna Chaden Doom 09/11/2014, 10:11 AM Leitha Bleak, PT [phone]  Addendum:  New PT order received. Leitha Bleak, [place]

## 2014-09-11 NOTE — Progress Notes (Signed)
[facility] [place] NAME: Tricia Miller    MR#:  [id number]  DATE OF BIRTH:  [birth year]  SUBJECTIVE: Seen today, patient complains of severe difficulty swallowing complaints of throat pain and chest pain when she strides to swallow. On table to swallow even pills. Status post EGD yesterday without any esophageal stricture but  Barrett's esophagus. And a large hiatal hernia   CHIEF COMPLAINT:   Chief Complaint  Patient presents with  . Weakness    REVIEW OF SYSTEMS:    Review of Systems  Constitutional: Negative for fever and chills.  HENT: Negative for hearing loss.   Eyes: Negative for blurred vision, double vision and photophobia.  Respiratory: Negative for cough, hemoptysis and shortness of breath.   Cardiovascular: Negative for palpitations, orthopnea and leg swelling.  Gastrointestinal: Positive for abdominal pain. Negative for vomiting and diarrhea.  Genitourinary: Negative for dysuria and urgency.  Musculoskeletal: Negative for myalgias and neck pain.  Skin: Negative for rash.  Neurological: Negative for dizziness, focal weakness, seizures, weakness and headaches.  Psychiatric/Behavioral: Negative for memory loss. The patient does not have insomnia.     Nutrition:  Tolerating Diet:no Tolerating PT:      DRUG ALLERGIES:   Allergies  Allergen Reactions  . Bactrim [Sulfamethoxazole-Trimethoprim] Swelling  . Ciprofloxacin Swelling  . Pregabalin Swelling  . Felodipine     Other reaction(s): UNKNOWN  . Levaquin [Levofloxacin In D5w] Other (See Comments)    Unknown  . Trimethoprim Other (See Comments)    UNKNOWN   . Codeine Nausea Only  . Morphine Nausea Only  . Penicillins Rash    VITALS:  Blood pressure 114/47, pulse 95, temperature 99 F (37.2 C), temperature source Oral, resp. rate 20, height 4\' 11"  (1.499 m), weight 79.561 kg (175 lb 6.4 oz), SpO2 98 %.  PHYSICAL EXAMINATION:   Physical  Exam  GENERAL:  81 y.o.-year-old patient lying in the bed  YES: Pupils equal, round, reactive to light and accommodation. No scleral icterus. Extraocular muscles intact.  HEENT: Head atraumatic, normocephalic. Oropharynx and nasopharynx clear.  NECK:  Supple, no jugular venous distention. No thyroid enlargement, no tenderness.  LUNGS: Normal breath sounds bilaterally, no wheezing, rales,rhonchi or crepitation. No use of accessory muscles of respiration.  CARDIOVASCULAR: S1, S2 normal. No murmurs, rubs, or gallops.  ABDOMEN: Distended, tender to palpation. Bowel Sounds .: No pedal edema, cyanosis, or clubbing.  NEUROLOGIC: Cranial nerves II through XII are intact. Muscle strength 5/5 in all extremities. Sensation intact. Gait not checked.  PSYCHIATRIC: The patient is alert and oriented x 3.  SKIN: No obvious rash, lesion, or ulcer.    LABORATORY PANEL:   CBC  Recent Labs Lab 09/09/2014 0440  WBC 11.4*  HGB 12.2  HCT 38.6  PLT 182   ------------------------------------------------------------------------------------------------------------------  Chemistries   Recent Labs Lab 08/29/2014 1451 09/05/2014 0440  NA 142 144  K 2.8* 3.5  CL 105 107  CO2 22 27  GLUCOSE 104* 103*  BUN 28* 28*  CREATININE 0.72 0.69  CALCIUM 8.0* 7.9*  MG 2.0  --   AST 26  --   ALT 12*  --   ALKPHOS 101  --   BILITOT 0.8  --    ------------------------------------------------------------------------------------------------------------------  Cardiac Enzymes  Recent Labs Lab 08/27/2014 0440  TROPONINI 0.03   ------------------------------------------------------------------------------------------------------------------  RADIOLOGY:  Ct Abdomen Pelvis W Contrast  08/28/2014   CLINICAL DATA:  Vomiting weakness and productive cough. Worsened since last night.  EXAM:  CT ABDOMEN AND PELVIS WITH CONTRAST  TECHNIQUE: Multidetector CT imaging of the abdomen and pelvis was performed using the standard  protocol following bolus administration of intravenous contrast.  CONTRAST:  180mL OMNIPAQUE IOHEXOL 300 MG/ML  SOLN  COMPARISON:  Radiographs 09/06/2014  FINDINGS: Lower chest: No significant abnormality. Minor linear basilar scarring.  Hepatobiliary: There are multiple calculi within the gallbladder lumen. There is no gallbladder mural thickening or pericholecystic inflammatory change. There are multiple benign hepatic cysts measuring up to 2.7 cm. There is no significant liver lesion. There is no bile duct dilatation.  Pancreas: Fatty replaced. No pancreatic mass or ductal dilatation. No inflammatory changes.  Spleen: Normal  Adrenals/Urinary Tract: Benign left adrenal nodule containing fat and calcium consistent with a 1.8 cm myelolipoma. There are large benign renal cysts bilaterally, measuring up to 9.5 cm on the left and 7.3 cm on the right. Ureters and urinary bladder appear unremarkable.  Stomach/Bowel: There is a large hiatal hernia. The stomach, small bowel and colon are otherwise remarkable only for minimal uncomplicated colonic diverticulosis.  Vascular/Lymphatic: The abdominal aorta is normal in caliber. There is moderate atherosclerotic calcification. There is no adenopathy in the abdomen or pelvis.  Reproductive: Hysterectomy.  No adnexal abnormalities.  Other: No acute inflammatory changes are evident in the abdomen or pelvis. There is no ascites.  Musculoskeletal: Multiple vertebral hemangiomata. No significant musculoskeletal lesion. Small fat containing inguinal hernias incidentally noted bilaterally.  IMPRESSION: 1. No acute findings. 2. Cholelithiasis 3. Benign fat containing left adrenal nodule 4. Large hiatal hernia 5. Benign cysts of the liver and kidneys.   Electronically Signed   By: Andreas Newport M.D.   On: 09/12/2014 17:11   Dg Abd Acute W/chest  08/31/2014   CLINICAL DATA:  Vomiting for 4 days, worse last night. Left side chest pain.  EXAM: DG ABDOMEN ACUTE W/ 1V CHEST   COMPARISON:  PA and lateral chest 08/21/2014.  CT chest 08/13/2014.  FINDINGS: Single view of the chest demonstrates clear lungs and normal heart size. No pneumothorax or pleural effusion is identified.  Kidneys of the abdomen show no free intraperitoneal air. The bowel gas pattern is nonobstructive. Atherosclerotic vascular disease is noted.  IMPRESSION: No acute finding chest or abdomen.   Electronically Signed   By: Inge Rise M.D.   On: 09/12/2014 14:15     ASSESSMENT AND PLAN:   Principal Problem:   Nausea and vomiting Active Problems:   Dysphagia   Dehydration  1.nausea.vomiting ; resolved;co  GI consulted secondary to dysphagia. Dysphagia status;s/p egd showing Barrett's esophagitis, large hiatal hernia, and possible antral most likely benign. Patient still has dysphagia unable to swallow pills ,start IV fluids, IV pain medications she may have odynophagia we will start her on iV Diflucan and follow the biopsy results. 2,h/o pituitary microadenoma;seen at [facility] CHOSE NOT TO GO for RT  DUE TO SIDE EFFECTS. 3.COPD;STABLE; 4.LETHARGY DUE TO MEDICAL PROBLEM; improved #5 nausea vomiting the coffee-ground vomiting likely of possible gastritis patient recently was taken on Xarelto. 6.History of proximal A. fib controlled: History of lymphoma 7;Hypokalemia secondary to nausea vomiting for by mouth intake ; Palliative care consult requested secondary to multiple medical problems and  recurrent admissions and failure to thrive. Patient has seen  Dr. Megan Salon during the last admission.   All the records are reviewed and case discussed with Care Management/Social Workerr. Management plans discussed with the patient, family and they are in agreement.  CODE STATUS: full  TOTAL TIME TAKING CARE OF THIS  PATIENT: 35 minutes.   POSSIBLE D/C IN 1-2  DAYS, DEPENDING ON CLINICAL CONDITION.   Epifanio Lesches M.D on 09/11/2014 at 12:34 PM  Between 7am to 6pm - Pager -  [phone]  After 6pm go to [email or web address] - password [email or web address] [facility]  Office  [phone]  CC: Primary care physician; Kingsley Callander, MD

## 2014-09-12 ENCOUNTER — Inpatient Hospital Stay: Payer: Medicare Other

## 2014-09-12 DIAGNOSIS — A419 Sepsis, unspecified organism: Secondary | ICD-10-CM

## 2014-09-12 DIAGNOSIS — J441 Chronic obstructive pulmonary disease with (acute) exacerbation: Secondary | ICD-10-CM

## 2014-09-12 DIAGNOSIS — J9600 Acute respiratory failure, unspecified whether with hypoxia or hypercapnia: Secondary | ICD-10-CM

## 2014-09-12 DIAGNOSIS — J690 Pneumonitis due to inhalation of food and vomit: Secondary | ICD-10-CM

## 2014-09-12 DIAGNOSIS — R6521 Severe sepsis with septic shock: Secondary | ICD-10-CM

## 2014-09-12 LAB — BLOOD GAS, ARTERIAL
ACID-BASE DEFICIT: 11.9 mmol/L — AB (ref 0.0–2.0)
Allens test (pass/fail): POSITIVE — AB
Bicarbonate: 14.7 mEq/L — ABNORMAL LOW (ref 21.0–28.0)
FIO2: 1
MECHANICAL RATE: 20
MECHVT: 500 mL
O2 SAT: 98.5 %
PATIENT TEMPERATURE: 37
PEEP/CPAP: 5 cmH2O
PH ART: 7.23 — AB (ref 7.350–7.450)
RATE: 20 resp/min
pCO2 arterial: 35 mmHg (ref 32.0–48.0)
pO2, Arterial: 133 mmHg — ABNORMAL HIGH (ref 83.0–108.0)

## 2014-09-12 LAB — GLUCOSE, CAPILLARY: Glucose-Capillary: 132 mg/dL — ABNORMAL HIGH (ref 65–99)

## 2014-09-12 LAB — LACTIC ACID, PLASMA: Lactic Acid, Venous: 7.3 mmol/L (ref 0.5–2.0)

## 2014-09-12 LAB — MRSA PCR SCREENING: MRSA by PCR: POSITIVE — AB

## 2014-09-12 MED ORDER — PIPERACILLIN-TAZOBACTAM 3.375 G IVPB
3.3750 g | Freq: Three times a day (TID) | INTRAVENOUS | Status: DC
  Administered 2014-09-12 – 2014-09-13 (×3): 3.375 g via INTRAVENOUS
  Filled 2014-09-12 (×6): qty 50

## 2014-09-12 MED ORDER — MIDAZOLAM HCL 2 MG/2ML IJ SOLN
4.0000 mg | INTRAMUSCULAR | Status: DC | PRN
  Administered 2014-09-13: 2 mg via INTRAVENOUS
  Administered 2014-09-13 (×2): 4 mg via INTRAVENOUS
  Administered 2014-09-13: 2 mg via INTRAVENOUS
  Filled 2014-09-12 (×3): qty 4

## 2014-09-12 MED ORDER — STERILE WATER FOR INJECTION IJ SOLN
INTRAMUSCULAR | Status: AC
  Administered 2014-09-12: 10 mL via INTRAMUSCULAR
  Filled 2014-09-12: qty 10

## 2014-09-12 MED ORDER — MUPIROCIN 2 % EX OINT
1.0000 "application " | TOPICAL_OINTMENT | Freq: Two times a day (BID) | CUTANEOUS | Status: DC
  Administered 2014-09-12 – 2014-09-13 (×2): 1 via NASAL
  Filled 2014-09-12: qty 22

## 2014-09-12 MED ORDER — SODIUM CHLORIDE 0.9 % IV BOLUS (SEPSIS)
1000.0000 mL | Freq: Once | INTRAVENOUS | Status: AC
  Administered 2014-09-12: 1000 mL via INTRAVENOUS

## 2014-09-12 MED ORDER — FENTANYL CITRATE (PF) 100 MCG/2ML IJ SOLN
100.0000 ug | INTRAMUSCULAR | Status: DC | PRN
  Administered 2014-09-12 – 2014-09-13 (×4): 100 ug via INTRAVENOUS
  Filled 2014-09-12 (×4): qty 2

## 2014-09-12 MED ORDER — VANCOMYCIN HCL IN DEXTROSE 750-5 MG/150ML-% IV SOLN
750.0000 mg | INTRAVENOUS | Status: DC
Start: 2014-09-12 — End: 2014-09-13
  Administered 2014-09-12: 750 mg via INTRAVENOUS
  Filled 2014-09-12 (×3): qty 150

## 2014-09-12 MED ORDER — PIPERACILLIN-TAZOBACTAM 3.375 G IVPB
3.3750 g | Freq: Three times a day (TID) | INTRAVENOUS | Status: DC
  Filled 2014-09-12 (×3): qty 50

## 2014-09-12 MED ORDER — STERILE WATER FOR INJECTION IV SOLN
INTRAVENOUS | Status: DC
  Administered 2014-09-12: 19:00:00 via INTRAVENOUS
  Filled 2014-09-12 (×5): qty 850

## 2014-09-12 MED ORDER — NOREPINEPHRINE BITARTRATE 1 MG/ML IV SOLN
0.0000 ug/min | INTRAVENOUS | Status: DC
  Administered 2014-09-12: 20 ug/min via INTRAVENOUS
  Administered 2014-09-13: 50 ug/min via INTRAVENOUS
  Administered 2014-09-13: 40 ug/min via INTRAVENOUS
  Administered 2014-09-13: 50 ug/min via INTRAVENOUS
  Filled 2014-09-12 (×4): qty 16

## 2014-09-12 MED ORDER — VECURONIUM BROMIDE 10 MG IV SOLR
INTRAVENOUS | Status: AC
  Administered 2014-09-12: 10 mg
  Filled 2014-09-12: qty 10

## 2014-09-12 MED ORDER — NOREPINEPHRINE 4 MG/250ML-% IV SOLN
INTRAVENOUS | Status: AC
  Administered 2014-09-12: 12:00:00
  Filled 2014-09-12: qty 250

## 2014-09-12 MED ORDER — BUDESONIDE 0.5 MG/2ML IN SUSP
0.5000 mg | Freq: Two times a day (BID) | RESPIRATORY_TRACT | Status: DC
  Administered 2014-09-12 – 2014-09-13 (×2): 0.5 mg via RESPIRATORY_TRACT
  Filled 2014-09-12 (×2): qty 2

## 2014-09-12 MED ORDER — FENTANYL 2500MCG IN NS 250ML (10MCG/ML) PREMIX INFUSION
10.0000 ug/h | INTRAVENOUS | Status: DC
  Administered 2014-09-12: 10 ug/h via INTRAVENOUS
  Administered 2014-09-13: 200 ug/h via INTRAVENOUS
  Filled 2014-09-12 (×2): qty 250

## 2014-09-12 MED ORDER — SODIUM CHLORIDE 0.9 % IV SOLN: INTRAVENOUS | Status: DC | PRN

## 2014-09-12 MED ORDER — STERILE WATER FOR INJECTION IJ SOLN
10.0000 mL | Freq: Once | INTRAMUSCULAR | Status: AC
  Administered 2014-09-12: 10 mL via INTRAMUSCULAR

## 2014-09-12 MED ORDER — METHYLPREDNISOLONE SODIUM SUCC 125 MG IJ SOLR
125.0000 mg | Freq: Once | INTRAMUSCULAR | Status: AC
  Administered 2014-09-12: 125 mg via INTRAVENOUS
  Filled 2014-09-12: qty 2

## 2014-09-12 MED ORDER — SODIUM BICARBONATE 8.4 % IV SOLN
INTRAVENOUS | Status: AC
  Administered 2014-09-12: 150 meq via INTRAVENOUS
  Filled 2014-09-12: qty 150

## 2014-09-12 MED ORDER — VECURONIUM BROMIDE 10 MG IV SOLR
10.0000 mg | Freq: Once | INTRAVENOUS | Status: AC
  Administered 2014-09-12: 10 mg via INTRAVENOUS

## 2014-09-12 MED ORDER — IPRATROPIUM-ALBUTEROL 0.5-2.5 (3) MG/3ML IN SOLN
3.0000 mL | RESPIRATORY_TRACT | Status: DC
  Administered 2014-09-12 – 2014-09-13 (×6): 3 mL via RESPIRATORY_TRACT
  Filled 2014-09-12 (×6): qty 3

## 2014-09-12 MED ORDER — NOREPINEPHRINE 4 MG/250ML-% IV SOLN
0.0000 ug/min | INTRAVENOUS | Status: DC
  Administered 2014-09-12: 15 ug/min via INTRAVENOUS
  Administered 2014-09-12: 20 ug/min via INTRAVENOUS
  Filled 2014-09-12: qty 250

## 2014-09-12 MED ORDER — ALBUTEROL SULFATE (2.5 MG/3ML) 0.083% IN NEBU: 2.5000 mg | INHALATION_SOLUTION | Freq: Once | RESPIRATORY_TRACT | Status: DC

## 2014-09-12 MED ORDER — HYDROCORTISONE SOD SUCCINATE 100 MG PF FOR IT USE
60.0000 mg | Freq: Four times a day (QID) | INTRAMUSCULAR | Status: DC
  Filled 2014-09-12 (×5): qty 0.6

## 2014-09-12 MED ORDER — MIDAZOLAM HCL 2 MG/2ML IJ SOLN
2.0000 mg | Freq: Once | INTRAMUSCULAR | Status: AC
  Administered 2014-09-12: 2 mg via INTRAVENOUS

## 2014-09-12 MED ORDER — SODIUM BICARBONATE 8.4 % IV SOLN
150.0000 meq | Freq: Once | INTRAVENOUS | Status: AC
  Administered 2014-09-12: 150 meq via INTRAVENOUS

## 2014-09-12 MED ORDER — MIDAZOLAM HCL 2 MG/2ML IJ SOLN
INTRAMUSCULAR | Status: AC
  Administered 2014-09-12: 2 mg
  Filled 2014-09-12: qty 4

## 2014-09-12 MED ORDER — VANCOMYCIN HCL IN DEXTROSE 750-5 MG/150ML-% IV SOLN
750.0000 mg | INTRAVENOUS | Status: AC
  Administered 2014-09-12: 750 mg via INTRAVENOUS
  Filled 2014-09-12: qty 150

## 2014-09-12 MED ORDER — VASOPRESSIN 20 UNIT/ML IV SOLN
0.0300 [IU]/min | INTRAVENOUS | Status: DC
Start: 2014-09-12 — End: 2014-09-13
  Administered 2014-09-12 – 2014-09-13 (×2): 0.03 [IU]/min via INTRAVENOUS
  Filled 2014-09-12 (×2): qty 2

## 2014-09-12 MED ORDER — FENTANYL CITRATE (PF) 100 MCG/2ML IJ SOLN
100.0000 ug | Freq: Once | INTRAMUSCULAR | Status: AC
  Administered 2014-09-12: 100 ug via INTRAVENOUS

## 2014-09-12 MED ORDER — DEXTROSE 5 % IV SOLN: 0.0000 ug/min | INTRAVENOUS | Status: DC

## 2014-09-12 MED ORDER — HYDROCORTISONE NA SUCCINATE PF 100 MG IJ SOLR
60.0000 mg | Freq: Four times a day (QID) | INTRAMUSCULAR | Status: DC
Start: 2014-09-12 — End: 2014-09-13
  Administered 2014-09-12 – 2014-09-13 (×4): 60 mg via INTRAVENOUS
  Filled 2014-09-12 (×4): qty 2

## 2014-09-12 MED ORDER — FENTANYL CITRATE (PF) 100 MCG/2ML IJ SOLN
INTRAMUSCULAR | Status: AC
  Administered 2014-09-12: 100 ug
  Filled 2014-09-12: qty 4

## 2014-09-12 MED ORDER — MORPHINE SULFATE 2 MG/ML IJ SOLN: 2.0000 mg | INTRAMUSCULAR | Status: DC | PRN

## 2014-09-12 MED ORDER — FUROSEMIDE 10 MG/ML IJ SOLN
40.0000 mg | Freq: Once | INTRAMUSCULAR | Status: AC
  Administered 2014-09-12: 40 mg via INTRAVENOUS
  Filled 2014-09-12: qty 4

## 2014-09-12 MED ORDER — CHLORHEXIDINE GLUCONATE CLOTH 2 % EX PADS
6.0000 | MEDICATED_PAD | Freq: Every day | CUTANEOUS | Status: DC
  Administered 2014-09-13: 6 via TOPICAL

## 2014-09-12 NOTE — Consult Note (Signed)
Pt with aspiration pneumonia and bronchoscopy done to remove mucus plugs with success.  She is on vent now at 100%.  Pt with swelling and inflammation of GEJ previous Barretts mucosa in that area on EGD which may have hindered swallowing.  Chest clear in anterior fields.  Abd with bowel sounds present. Family has made her a no code.   If long term ventilation recommended may need a feeding tube but I doubt the wisdom of that.

## 2014-09-12 NOTE — Procedures (Signed)
  Central Venous Catheter Placement: Indication: Patient receiving vesicant or irritant drug.; Patient receiving intravenous therapy for longer than 5 days.; Patient has limited or no vascular access.   Consent:emergent  Risks and benefits explained in detail including risk of infection, bleeding, respiratory failure and death..   Hand washing performed prior to starting the procedure.   Procedure: An active timeout was performed and correct patient, name, & ID confirmed.  After explaining risk and benefits, patient was positioned correctly for central venous access. Patient was prepped using strict sterile technique including chlorohexadine preps, sterile drape, sterile gown and sterile gloves.  The area was prepped, draped and anesthetized in the usual sterile manner. Patient comfort was obtained.  A triple lumen catheter was placed in LEft Internal Jugular Vein There was good blood return, catheter caps were placed on lumens, catheter flushed easily, the line was secured and a sterile dressing and BIO-PATCH applied.   Ultrasound was used to visualize vasculature and guidance of needle.   Number of Attempts: 1 Complications:none Estimated Blood Loss: none Chest Radiograph indicated and ordered.  Procedure time:10 mins Operator: Korrie Hofbauer.   Corrin Parker, M.D.  [facility]  Medical Director [place] Director [facility] Cardio-Pulmonary Department

## 2014-09-12 NOTE — Progress Notes (Addendum)
Confused. MD has attempted to talk to daughters. ABG and portable chest xray ordered. IV lasix and solumedrol was ordered. MD was at the bedside. Respiratory was at the bedside. Pt placed on bipap. Very lethargic. Pulls at mask. Sitter at the bedside. Report given to [facility] at the bedside. Repiratory present for the tx. Family updated about status by MD. Tx to CCU 7.

## 2014-09-12 NOTE — Procedures (Signed)
Endotracheal Intubation: Patient required placement of an artificial airway secondary to resp failure.   Consent: Emergent.   Hand washing performed prior to starting the procedure.   Medications administered for sedation prior to procedure: Midazolam 2 mg IV,  Vecuronium 10 mg IV, Fentanyl 100 mcg IV.   Procedure: A time out procedure was called and correct patient, name, & ID confirmed. Needed supplies and equipment were assembled and checked to include ETT, 10 ml syringe, Glidescope, Mac and Miller blades, suction, oxygen and bag mask valve, end tidal CO2 monitor. Patient was positioned to align the mouth and pharynx to facilitate visualization of the glottis.  Heart rate, SpO2 and blood pressure was continuously monitored during the procedure. Pre-oxygenation was conducted prior to intubation and endotracheal tube was placed through the vocal cords into the trachea.  During intubation an assistant applied gentle pressure to the cricoid cartilage.   The artificial airway was placed under direct visualization via glidescope route using a 8.5 ETT on the first attempt.    ETT was secured at 23 cm mark.    Placement was confirmed by auscuitation of lungs with good breath sounds bilaterally and no stomach sounds.  Condensation was noted on endotracheal tube.  Pulse ox 100%.  CO2 detector in place with appropriate color change.   Complications: None .   Operator: Jessina Marse.   Chest radiograph ordered and pending.   Comments: OGT placed via glidescope.  Corrin Parker, M.D.  [facility]  Medical Director [place] Director [facility] Cardio-Pulmonary Department

## 2014-09-12 NOTE — Progress Notes (Signed)
Pt reporteed SOB, MD Konidena at the bedside. Stats 77% 2 L nonrebreather added. No further orders.

## 2014-09-12 NOTE — Consult Note (Signed)
Palliative Medicine Inpatient Consult Follow Up Note   Name: Tricia Miller Date: 09/12/2014 MRN: [id number]  DOB: 12/23/1929  Referring Physician: Epifanio Lesches, MD  Palliative Care consult requested for this 81 y.o. female for goals of medical therapy in patient with acute respiratory failure now.    REVIEW OF SYSTEMS:  Patient is not able to provide ROS  CODE STATUS: Full code   PAST MEDICAL HISTORY: Past Medical History  Diagnosis Date  . COPD (chronic obstructive pulmonary disease)   . Vertigo   . Barrett esophagus   . Lymphoma of lymph nodes   . A-fib   . Pituitary macroadenoma     PAST SURGICAL HISTORY:  Past Surgical History  Procedure Laterality Date  . Abdominal hysterectomy    . Pituitary surgery    . Orthopedic surgery    . Esophagogastroduodenoscopy (egd) with propofol Left 08/27/2014    Procedure: ESOPHAGOGASTRODUODENOSCOPY (EGD) WITH PROPOFOL with possible dilation;  Surgeon: Manya Silvas, MD;  Location: [facility] ENDOSCOPY;  Service: Endoscopy;  Laterality: Left;    Vital Signs: BP 87/43 mmHg  Pulse 65  Temp(Src) 99.3 F (37.4 C) (Axillary)  Resp 20  Ht 4\' 11"  (1.499 m)  Wt 79.561 kg (175 lb 6.4 oz)  BMI 35.41 kg/m2  SpO2 90% Filed Weights   08/19/2014 1240 08/23/2014 2102  Weight: 80.695 kg (177 lb 14.4 oz) 79.561 kg (175 lb 6.4 oz)    Estimated body mass index is 35.41 kg/(m^2) as calculated from the following:   Height as of this encounter: 4\' 11"  (1.499 m).   Weight as of this encounter: 79.561 kg (175 lb 6.4 oz).  PHYSICAL EXAM: In resp distress on 100% face mask Dusky looking but eyes are open Neck w/o JVD or TM Hrt rrr tachy Lungs with ronchi throughout Abd soft with nl BS Skin dusky, dry  LABS: CBC:    Component Value Date/Time   WBC 11.4* 09/12/2014 0440   HGB 12.2 09/12/2014 0440   HCT 38.6 09/09/2014 0440   PLT 182 08/20/2014 0440   MCV 80.4 09/03/2014 0440   NEUTROABS 9.7* 09/08/2014 1451   LYMPHSABS 2.4  08/25/2014 1451   MONOABS 0.9 09/14/2014 1451   EOSABS 0.0 08/22/2014 1451   BASOSABS 0.1 09/11/2014 1451   Comprehensive Metabolic Panel:    Component Value Date/Time   NA 144 09/11/2014 0440   K 3.5 09/12/2014 0440   CL 107 08/23/2014 0440   CO2 27 09/04/2014 0440   BUN 28* 09/08/2014 0440   CREATININE 0.69 08/20/2014 0440   GLUCOSE 103* 08/21/2014 0440   CALCIUM 7.9* 09/10/2014 0440   AST 26 09/07/2014 1451   ALT 12* 08/21/2014 1451   ALKPHOS 101 08/22/2014 1451   BILITOT 0.8 09/04/2014 1451   PROT 6.0* 09/08/2014 1451   ALBUMIN 3.1* 09/01/2014 1451    IMPRESSION: 1)Acute respiratory failure resulting in intubation and ventilation measures.  Likely due to acute aspiration. 2)Advanced COPD --Had Pneumonia diagnosed on 6/27 in the ER but on return here on day of admission (7/2), she has only COPD but no infiltrate seen and no pneumonia diagnosed.  3)Macroadenoma  --with concerning CT findings here (optic nerve compression, fluid and blood ) --however, thought to be w/o change from CT brain done at [facility] --notes say she and family had refused radiation due to potential side effects 4)Lymphoma  --nothing in the electronic medical record providing details --patient sleepy and not interactive today so will have to explore this history tomorrow 5 Pain on swallowing: --  she had endoscopy showing NO STRICTURE (as she was thought to have) but did show: - LA Grade C reflux esophagitis. - Esophageal mucosal changes secondary to established short-segment Barrett's disease. - Large hiatus hernia. 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 Biopsied. - Duodenal lipoma. Biopsied. ---PPI and Carafate slurry was ordered 6Possible Dysphagia?  --Speech was consulted due to pateints complaints of pain on swallowing liquids or solids and this is what was noted by Speech earlier today:  Pt appeared to be able to swallow small amounts of liquid melted from ice chips and popcicle  but was hampered by a severely sore throat which resulted in pt stating she "just could not swallow!" followed by gagging then coughing x1. Pt described that is "hurts so much when I try to swallow". This discomfort, talking, and coughing increased pt's respiratory effort. Pt was given time to calm her breathing. She continued to talk and describe many ailments and how she could not understand why she did not get better "this time". MD was present in the room; pt described to her the "pain" she had when she tried to swallow. MD changed her meds, including Diflucan, to IV and fluids started. Rec. no oral diet today and allow for meds to begin working, healing. Encouraged pt to take the ice chips as she could tolerate. ST will f/u tomorrow w/ further po trials. NSG present and agreed. MD agreed.       7Constipation  --had diarrhea at home before coming in then no BM from 7/2 till recently (?) 8Electrolyte Dissaray --treated 9Weak and Dizzy and Nauseated  37fib- Paroxysmal --had been on Xarelto  11HTN 12Hypothyroidsim 13Pain and headaches 14Depression treated with zoloft 15Falls (sent to [place] for rehab after a fall and hosp stay at [facility])        PLAN: I was made aware of the sudden respiratory failure of this patient by Dr. Epifanio Lesches, attending.  She had been trying to reach pts two daughters and had not heard back from them.  The patients daughters then called in and I spoke with the daughter who is an Therapist, sports and she stated that patient had recently asked to be full code and had told family that she would want to be intubated and on a ventilator if that would help her get over something reversible.  She said she would not want to be on it for a long time, however.  This explains the recent change to full code status and given that the patient had given clear instructions to be intubated if needed, this is what was then done.   I informed the daughter that as a Palliative Care doctor, I  would be available to talk with them, if things change and aggressive care is not any longer desired.  I mentioned the issue of a possible dysphagia and the fact that other issues might need to be addressed as more is learned.  For now, I will follow patient from a distance to make sure that no further palliative related issues arise while she is getting an aggressive level of care per her previous instructions to family.    We ought to discuss her h/o lymphoma and the pituitary MACROadenoma  with family.    REFERRALS TO BE ORDERED:  None   More than 50% of the visit was spent in counseling/coordination of care: YES  Time Spent: 40 minutes

## 2014-09-12 NOTE — Progress Notes (Signed)
[facility] [place] NAME: Tricia Miller    MR#:  [id number]  DATE OF BIRTH:  02-Feb-1930  SUBJECTIVE: Seen today, disc and respiratory distress, oxygen saturations are in the upper 80s. Started on 100% nonrebreather she sat to 91% patient denies chest pain.   CHIEF COMPLAINT:   Chief Complaint  Patient presents with  . Weakness    REVIEW OF SYSTEMS:    Review of Systems  Constitutional: Negative for fever and chills.  HENT: Negative for hearing loss.   Eyes: Negative for blurred vision, double vision and photophobia.  Respiratory: Positive for shortness of breath. Negative for cough and hemoptysis.   Cardiovascular: Negative for palpitations, orthopnea and leg swelling.  Gastrointestinal: Positive for abdominal pain. Negative for vomiting and diarrhea.  Genitourinary: Negative for dysuria and urgency.  Musculoskeletal: Negative for myalgias and neck pain.  Skin: Negative for rash.  Neurological: Negative for dizziness, focal weakness, seizures, weakness and headaches.  Psychiatric/Behavioral: Negative for memory loss. The patient does not have insomnia.     Nutrition:  Tolerating Diet:no Tolerating PT:      DRUG ALLERGIES:   Allergies  Allergen Reactions  . Bactrim [Sulfamethoxazole-Trimethoprim] Swelling  . Ciprofloxacin Swelling  . Pregabalin Swelling  . Felodipine     Other reaction(s): UNKNOWN  . Levaquin [Levofloxacin In D5w] Other (See Comments)    Unknown  . Trimethoprim Other (See Comments)    UNKNOWN   . Codeine Nausea Only  . Morphine Nausea Only  . Penicillins Rash    VITALS:  Blood pressure 102/60, pulse 100, temperature 98.6 F (37 C), temperature source Oral, resp. rate 20, height 4\' 11"  (1.499 m), weight 79.561 kg (175 lb 6.4 oz), SpO2 92 %.  PHYSICAL EXAMINATION:   Physical Exam  GENERAL:  81 y.o.-year-old patient lying in the bed  YES: Pupils equal, round, reactive to light and  accommodation. No scleral icterus. Extraocular muscles intact.  HEENT: Head atraumatic, normocephalic. Oropharynx and nasopharynx clear.  NECK:  Supple, no jugular venous distention. No thyroid enlargement, no tenderness.  LUNGS ; bilateral posterior breath sounds present. CARDIOVASCULAR: S1, S2 normal. No murmurs, rubs, or gallops.  ABDOMEN: Distended, tender to palpation. Bowel Sounds .: No pedal edema, cyanosis, or clubbing.  NEUROLOGIC: Cranial nerves II through XII are intact. Muscle strength 5/5 in all extremities. Sensation intact. Gait not checked.  PSYCHIATRIC: The patient is alert and oriented x 3.  SKIN: No obvious rash, lesion, or ulcer.    LABORATORY PANEL:   CBC  Recent Labs Lab 08/22/2014 0440  WBC 11.4*  HGB 12.2  HCT 38.6  PLT 182   ------------------------------------------------------------------------------------------------------------------  Chemistries   Recent Labs Lab 09/04/2014 1451 09/12/2014 0440  NA 142 144  K 2.8* 3.5  CL 105 107  CO2 22 27  GLUCOSE 104* 103*  BUN 28* 28*  CREATININE 0.72 0.69  CALCIUM 8.0* 7.9*  MG 2.0  --   AST 26  --   ALT 12*  --   ALKPHOS 101  --   BILITOT 0.8  --    ------------------------------------------------------------------------------------------------------------------  Cardiac Enzymes  Recent Labs Lab 08/31/2014 0440  TROPONINI 0.03   ------------------------------------------------------------------------------------------------------------------  RADIOLOGY:  No results found.   ASSESSMENT AND PLAN:   Principal Problem:   Nausea and vomiting Active Problems:   Dysphagia   Dehydration  1.nausea.vomiting ; resolved; due to grade C esophagitis, hiatal hernia,: Continue IV fluid continue PPIs. DC IV fluids because of shortness of breath and  possible pulmonary edema.  GI consulted secondary to dysphagia. May have odynophagia. Patient to EGD did not show any stricture but she is still having a lot  of trouble swallowing. Started on IV Diflucan. Continue PPIs, Carafate for grade C esophagitis. GI is following Dysphagia status;s/p egd showing Barrett's esophagitis, large hiatal hernia, and possible antral most likely benign. Follow biopsy 2,h/o pituitary microadenoma;seen at [facility],FAMILY CHOSE NOT TO GO for RT  DUE TO SIDE EFFECTS. 3.COPD;STABLE; 4.LETHARGY DUE TO MEDICAL PROBLEM; improved #5 nausea vomiting the coffee-ground vomiting likely of possible gastritis patient recently was taken on Xarelto. 6.History of proximal A. fib ; rate uncontrolled at this time secondary to respiratory distress., Patient to will be started  On amiodarone if needed, consult cardiology if persistently gets tachycardic. History of lymphoma 7;Hypokalemia secondary to nausea vomiting for by mouth intake ; Palliative care consult requested secondary to multiple medical problems and  recurrent admissions and failure to thrive. Patient has seen  Dr. Megan Salon during the last admission. Condition is critical I called the patient's family waiting for their reply at this time   Respiratory distress secondary to possible pulmonary edema: Started the Lasix, discontinued IV fluids. coinue 100% nonrebreather if patient not able to keep it to start BiPAP , chest x-ray stat, blood gas ,continue IV Lasix, IV Solu-Medrol, nebulizers.   All the records are reviewed and case discussed with Care Management/Social Workerr. Management plans discussed with the patient, family and they are in agreement.  CODE STATUS: full  TOTAL TIME TAKING CARE OF THIS PATIENT: 35 minutes.   POSSIBLE D/C IN 1-2  DAYS, DEPENDING ON CLINICAL CONDITION.   Epifanio Lesches M.D on 09/12/2014 at 10:37 AM  Between 7am to 6pm - Pager - [phone]  After 6pm go to [email or web address] - password [email or web address] [facility]  Office  [phone]  CC: Primary care physician; Kingsley Callander, MD

## 2014-09-12 NOTE — Progress Notes (Signed)
Speech Therapy Note: pt has been transferred to CCU and orally intubated sec. to decline in respiratory status. ST will f/u w/ pt's status and assessment of toleration of oral diet when appropriate. NSG updated.

## 2014-09-12 NOTE — Progress Notes (Signed)
Patient transferred from 2A.  Dr. Mortimer Fries at bedside and gave order for 2mg  versed, 145mcg fentanyl and 10mg  vecuronium for emergent intubation.

## 2014-09-12 NOTE — Progress Notes (Signed)
Pt arrived from 2A in respiratory distress and was obtunded on arrival to ICU Dr Mortimer Fries at pts bedside to assess pt; pt intubated and central line and arterial line placed emergently by Dr Mortimer Fries using sterile technique.  Chest xray performed to verify endotracheal tube and central line placement per Dr Zoila Shutter verbal orders central line ok to use; bronchoscopy performed by Dr. Mortimer Fries at bedside; Pt received a total of 5 NS fluid boluses due to a CVP of 3 per Dr Zoila Shutter orders; following fluid boluses CVP 6 Dr Mortimer Fries aware no further orders given for CVP reading, pt currently on levophed and vasopressin to maintain map >65; Fentanyl drip initiated due to increased agitation despite iv pushes of fentanyl; recent arterial blood gas results showed pH of 7.14 Dr Mortimer Fries notified 3 amps of sodium bicarb administered and 150 meq sodium bicarb drip ordered to infuse at 100 ml/hr per Dr Zoila Shutter telephone orders will recheck abg in the am; Dr Mortimer Fries also gave telephone orders per family request to change pts code status to DNR verified code status change with pts daughter Stanton Kidney; pt remains in afibb on cardiac monitor with hr ranging between 90-150's Dr Mortimer Fries aware no orders given for telemetry reading; pts family updated about plan of care and questions answered will continue to monitor and assess pt

## 2014-09-12 NOTE — Procedures (Signed)
PROCEDURE: BRONCHOSCOPY Therapeutic Aspiration of Tracheobronchial Tree  PROCEDURE DATE: 09/12/2014  TIME:  NAME:  Tricia Miller  DOB:Dec 11, 1929  MRN: [id number] LOC:  IC07A/IC07A-AA    CODE STATUS:  DNR    Code Status Orders        Start     Ordered   08/20/2014 2115  Full code   Continuous     08/22/2014 2115          Indications/Preliminary Diagnosis:   Consent: (Place X beside choice/s below)  The benefits, risks and possible complications of the procedure were        explained to:  ___ patient  _x__ patient's family  ___ other:___________  who verbalized understanding and gave:  ___ verbal  ___ written  _x__ verbal and written  ___ telephone  ___ other:________ consent.      Unable to obtain consent; procedure performed on emergent basis.     Other:       PRESEDATION ASSESSMENT: History and Physical has been performed. Patient meds and allergies have been reviewed. Presedation airway examination has been performed and documented. Baseline vital signs, sedation score, oxygenation status, and cardiac rhythm were reviewed. Patient was deemed to be in satisfactory condition to undergo the procedure.    PREMEDICATIONS:   Sedative/Narcotic Amt Dose   Versed  mg   Fentanyl  mcg  Diprivan  mg            PROCEDURE DETAILS: Timeout performed and correct patient, name, & ID confirmed. Following prep per Pulmonary policy, appropriate sedation was administered. The Bronchoscope was inserted in to oral cavity with bite block in place. Therapeutic aspiration of Tracheobronchial tree was performed.  Airway exam proceeded with findings, technical procedures, and specimen collection as noted below. At the end of exam the scope was withdrawn without incident. Impression and Plan as noted below.           Airway Prep (Place X beside choice below)   1% Transtracheal Lidocaine Anesthetization 7 cc   Patient prepped per Bronchoscopy Lab Policy       Insertion  Route (Place X beside choice below)   Nasal   Oral  x Endotracheal Tube   Tracheostomy   INTRAPROCEDURE MEDICATIONS:  Sedative/Narcotic Amt Dose   Versed  mg   Fentanyl  mcg  Diprivan  mg       Medication Amt Dose  Medication Amt Dose  Lidocaine 1%  cc  Epinephrine 1:10,000 sol  cc  Xylocaine 4%  cc  Cocaine  cc   TECHNICAL PROCEDURES: (Place X beside choice below)   Procedures  Description    None     Electrocautery     Cryotherapy     Balloon Dilatation     Bronchography     Stent Placement   x  Therapeutic Aspiration Thick purulent secretions    Laser/Argon Plasma    Brachytherapy Catheter Placement    Foreign Body Removal         SPECIMENS (Sites): (Place X beside choice below)  Specimens Description   No Specimens Obtained     Washings   x Lavage RLL, RML thick purulent secretions extracted with aggressive suctioning   Biopsies    Fine Needle Aspirates    Brushings    Sputum    FINDINGS: thick purulent secretion thought out  airways with extensive erythema and inflammation  ESTIMATED BLOOD LOSS: none COMPLICATIONS/RESOLUTION: none      IMPRESSION:POST-PROCEDURE DX: pnuemonia    RECOMMENDATION/PLAN:  BAL  cultures IV abx IV steroids MV support     Corrin Parker, M.D.  [facility]  Medical Director [place] Director [facility] Cardio-Pulmonary Department

## 2014-09-12 NOTE — Progress Notes (Signed)
PT Cancellation Note  Patient Details Name: Tricia Miller MRN: [id number] DOB: 02/26/1929   Cancelled Treatment:    Reason Eval/Treat Not Completed: Patient not medically ready;Medical issues which prohibited therapy (Pt transferred to CCU and intubated secondary to decline in respiratory status.)  D/t change/decline in medical status, will complete current PT order.  Please re-consult PT when pt is medically appropriate to participate in PT.   [name] 09/12/2014, 5:47 PM Leitha Bleak, [place]

## 2014-09-12 NOTE — Consult Note (Signed)
PULMONARY / CRITICAL CARE MEDICINE   Name: Tricia Miller MRN: [id number] DOB: Feb 23, 1929    ADMISSION DATE:  09/14/2014   CHIEF COMPLAINT:    acute SOB   HISTORY OF PRESENT ILLNESS  81 y.o. female with a known history of COPD, esophageal stricture, lymphoma, A. fib, pituitary macroadenoma was admitted to hospital 2 weeks ago for COPD at that time she had complain of dysphagia and barium swallow was done which showed she is has esophageal stricture.  She was discharged to rehabilitation Center because of generalized weakness. Patient subsequently developed vomiting with any kind of food or medication.   Patient was emergently transferred to ICU for progressive resp distress, patient emergently intubated and placed on full mv support  There is report that patient may have aspiration pneumonia, patient unresponsive, using accessory muscles to breathe, with low blood pressure         PAST MEDICAL HISTORY    :  Past Medical History  Diagnosis Date  . COPD (chronic obstructive pulmonary disease)   . Vertigo   . Barrett esophagus   . Lymphoma of lymph nodes   . A-fib   . Pituitary macroadenoma    Past Surgical History  Procedure Laterality Date  . Abdominal hysterectomy    . Pituitary surgery    . Orthopedic surgery    . Esophagogastroduodenoscopy (egd) with propofol Left 08/28/2014    Procedure: ESOPHAGOGASTRODUODENOSCOPY (EGD) WITH PROPOFOL with possible dilation;  Surgeon: Manya Silvas, MD;  Location: [facility] ENDOSCOPY;  Service: Endoscopy;  Laterality: Left;   Prior to Admission medications   Medication Sig Start Date End Date Taking? Authorizing Provider  acetaminophen (TYLENOL) 325 MG tablet Take 2 tablets (650 mg total) by mouth every 6 (six) hours as needed for mild pain (or Fever >/= 101). 08/24/14  Yes Nicholes Mango, MD  albuterol (PROVENTIL HFA;VENTOLIN HFA) 108 (90 BASE) MCG/ACT inhaler Inhale 2 puffs into the lungs every 4 (four) hours as needed for  wheezing or shortness of breath.   Yes Historical Provider, MD  albuterol (PROVENTIL) (2.5 MG/3ML) 0.083% nebulizer solution Take 2.5 mg by nebulization every 4 (four) hours as needed for wheezing or shortness of breath.   Yes Historical Provider, MD  bisacodyl (DULCOLAX) 5 MG EC tablet Take 2 tablets (10 mg total) by mouth daily. 08/24/14  Yes Nicholes Mango, MD  clonazePAM (KLONOPIN) 0.5 MG tablet Take 0.25 mg by mouth every 12 (twelve) hours.   Yes Historical Provider, MD  diclofenac sodium (VOLTAREN) 1 % GEL Apply 2 g topically 4 (four) times daily.   Yes Historical Provider, MD  fluticasone (FLONASE) 50 MCG/ACT nasal spray Place 2 sprays into both nostrils daily.   Yes Historical Provider, MD  furosemide (LASIX) 20 MG tablet Take 20 mg by mouth daily as needed for edema.   Yes Historical Provider, MD  ipratropium-albuterol (DUONEB) 0.5-2.5 (3) MG/3ML SOLN Take 3 mLs by nebulization every 6 (six) hours as needed. 08/24/14  Yes Nicholes Mango, MD  levothyroxine (SYNTHROID, LEVOTHROID) 50 MCG tablet Take 50 mcg by mouth every morning.   Yes Historical Provider, MD  magnesium oxide (MAG-OX) 400 MG tablet Take 400 mg by mouth daily.   Yes Historical Provider, MD  meclizine (ANTIVERT) 25 MG tablet Take 50 mg by mouth 3 (three) times daily.   Yes Historical Provider, MD  nystatin (MYCOSTATIN) 100000 UNIT/ML suspension Take 5 mLs by mouth 4 (four) times daily.   Yes Historical Provider, MD  omeprazole (PRILOSEC) 40 MG capsule Take 40 mg  by mouth every morning.   Yes Historical Provider, MD  OxyCODONE HCl, Abuse Deter, (OXAYDO) 5 MG TABA Take 2.5 mg by mouth every 4 (four) hours as needed (for pain.).   Yes Historical Provider, MD  potassium chloride (KLOR-CON) 20 MEQ packet Take 20 mEq by mouth daily.   Yes Historical Provider, MD  promethazine (PHENERGAN) 12.5 MG tablet Take 12.5 mg by mouth 4 (four) times daily.   Yes Historical Provider, MD  promethazine (PHENERGAN) 25 MG/ML injection Inject 25 mg into the  vein every 6 (six) hours as needed for nausea or vomiting.   Yes Historical Provider, MD  scopolamine (TRANSDERM-SCOP, 1.5 MG,) 1 MG/3DAYS Place 1 patch onto the skin every 3 (three) days.   Yes Historical Provider, MD  sertraline (ZOLOFT) 100 MG tablet Take 100 mg by mouth daily.    Yes Historical Provider, MD  triamcinolone (KENALOG) 0.025 % cream Apply 1 application topically 2 (two) times daily.   Yes Historical Provider, MD  potassium chloride (KLOR-CON) 20 MEQ packet Take 20 mEq by mouth daily. 08/24/14 09/03/14  Nicholes Mango, MD   Allergies  Allergen Reactions  . Bactrim [Sulfamethoxazole-Trimethoprim] Swelling  . Ciprofloxacin Swelling  . Pregabalin Swelling  . Felodipine     Other reaction(s): UNKNOWN  . Levaquin [Levofloxacin In D5w] Other (See Comments)    Unknown  . Trimethoprim Other (See Comments)    UNKNOWN   . Codeine Nausea Only  . Morphine Nausea Only  . Penicillins Rash     FAMILY HISTORY   Family History  Problem Relation Age of Onset  . Diabetes Neg Hx   . Colon cancer Neg Hx   . Liver disease Neg Hx   . Coronary artery disease Mother   . Hypertension Father   . Stroke Father       SOCIAL HISTORY    reports that she has quit smoking. She does not have any smokeless tobacco history on file. She reports that she does not drink alcohol or use illicit drugs.  Review of Systems  Unable to perform ROS: critical illness      VITAL SIGNS    Temp:  [98.6 F (37 C)-99.3 F (37.4 C)] 99.3 F (37.4 C) (07/27 1125) Pulse Rate:  [91-100] 100 (07/27 1020) Resp:  [19-20] 20 (07/27 0422) BP: (66-112)/(41-68) 98/52 mmHg (07/27 1200) SpO2:  [92 %-97 %] 92 % (07/27 1020) HEMODYNAMICS:   VENTILATOR SETTINGS:   INTAKE / OUTPUT:  Intake/Output Summary (Last 24 hours) at 09/12/14 1209 Last data filed at 09/12/14 0816  Gross per 24 hour  Intake  497.5 ml  Output      0 ml  Net  497.5 ml       PHYSICAL EXAM   Physical Exam  Constitutional: She  appears distressed.  HENT:  Head: Normocephalic and atraumatic.  Eyes: Pupils are equal, round, and reactive to light. No scleral icterus.  Neck: Normal range of motion. Neck supple.  Cardiovascular: Normal rate and regular rhythm.   No murmur heard. Pulmonary/Chest: She is in respiratory distress. She has no wheezes. She has rales.  resp distress  Abdominal: Soft. She exhibits no distension. There is no tenderness.  Musculoskeletal: She exhibits no edema.  Neurological: She displays normal reflexes. Coordination normal.  gcs<8T  Skin: Skin is warm. No rash noted. She is diaphoretic.       LABS   LABS:  CBC  Recent Labs Lab 08/24/2014 1451 09/02/2014 0440  WBC 13.2* 11.4*  HGB 12.8 12.2  HCT 41.7 38.6  PLT 193 182   Coag's No results for input(s): APTT, INR in the last 168 hours. BMET  Recent Labs Lab 08/19/2014 1451 08/19/2014 0440  NA 142 144  K 2.8* 3.5  CL 105 107  CO2 22 27  BUN 28* 28*  CREATININE 0.72 0.69  GLUCOSE 104* 103*   Electrolytes  Recent Labs Lab 09/15/2014 1451 08/28/2014 0440  CALCIUM 8.0* 7.9*  MG 2.0  --    Sepsis Markers  Recent Labs Lab 09/06/2014 1451 09/12/2014 2016  LATICACIDVEN 1.4 1.3   ABG No results for input(s): PHART, PCO2ART, PO2ART in the last 168 hours. Liver Enzymes  Recent Labs Lab 08/31/2014 1451  AST 26  ALT 12*  ALKPHOS 101  BILITOT 0.8  ALBUMIN 3.1*   Cardiac Enzymes  Recent Labs Lab 08/26/2014 2016 09/07/2014 2312 08/18/2014 0440  TROPONINI 0.03 0.04* 0.03   Glucose  Recent Labs Lab 09/11/14 2135 09/12/14 1117  GLUCAP 134* 132*     Recent Results (from the past 240 hour(s))  MRSA PCR Screening     Status: None   Collection Time: 08/30/2014 12:00 AM  Result Value Ref Range Status   MRSA by PCR NEGATIVE NEGATIVE Final    Comment:        The GeneXpert MRSA Assay (FDA approved for NASAL specimens only), is one component of a comprehensive MRSA colonization surveillance program. It is not intended  to diagnose MRSA infection nor to guide or monitor treatment for MRSA infections.      Current facility-administered medications:  .  albuterol (PROVENTIL) (2.5 MG/3ML) 0.083% nebulizer solution 2.5 mg, 2.5 mg, Nebulization, Once, Epifanio Lesches, MD .  feeding supplement (BOOST / RESOURCE BREEZE) liquid 1 Container, 1 Container, Oral, TID BM, Manya Silvas, MD, 1 Container at 09/11/14 2000 .  fentaNYL (SUBLIMAZE) 100 MCG/2ML injection, , , ,  .  fentaNYL (SUBLIMAZE) injection 100 mcg, 100 mcg, Intravenous, Q1H PRN, Flora Lipps, MD .  fluconazole (DIFLUCAN) IVPB 100 mg, 100 mg, Intravenous, Q24H, Epifanio Lesches, MD, 100 mg at 09/11/14 1318 .  hydrALAZINE (APRESOLINE) injection 10 mg, 10 mg, Intravenous, Q6H PRN, Vaughan Basta, MD .  ipratropium-albuterol (DUONEB) 0.5-2.5 (3) MG/3ML nebulizer solution 3 mL, 3 mL, Nebulization, Q6H PRN, Vaughan Basta, MD .  midazolam (VERSED) injection 4 mg, 4 mg, Intravenous, Q1H PRN, Flora Lipps, MD .  morphine 2 MG/ML injection 2 mg, 2 mg, Intravenous, Q4H PRN, Epifanio Lesches, MD .  norepinephrine (LEVOPHED) 55m in D5W 25102mpremix infusion, 0-40 mcg/min, Intravenous, Titrated, SnEpifanio LeschesMD .  norepinephrine (LEVOPHED) 76m32m50mL infusion, , , ,  .  nystatin (MYCOSTATIN) 100000 UNIT/ML suspension 500,000 Units, 5 mL, Oral, QID, RobManya SilvasD, 500,000 Units at 09/11/14 1718 .  ondansetron (ZOFRAN) injection 4 mg, 4 mg, Intravenous, Q6H PRN, VaiVaughan BastaD, 4 mg at 09/12/14 0817 .  pantoprazole (PROTONIX) injection 40 mg, 40 mg, Intravenous, Q12H, VaiVaughan BastaD, 40 mg at 09/12/14 0817 .  promethazine (PHENERGAN) injection 12.5 mg, 12.5 mg, Intravenous, Q6H PRN, SneEpifanio LeschesD, 12.5 mg at 09/12/14 0430 .  scopolamine (TRANSDERM-SCOP) 1 MG/3DAYS 1.5 mg, 1 patch, Transdermal, Q72H, RobManya SilvasD, 1.5 mg at 09/02/2014 1838 .  sodium chloride 0.9 % bolus 1,000 mL, 1,000 mL,  Intravenous, Once, KurFlora LippsD .  sodium chloride 0.9 % bolus 1,000 mL, 1,000 mL, Intravenous, Once, KurFlora LippsD .  sodium chloride 0.9 % injection 3 mL, 3 mL, Intravenous, PRN, VaiVaughan BastaD .  sterile water (preservative free) injection, , , ,  .  sucralfate (CARAFATE) 1 GM/10ML suspension 1 g, 1 g, Oral, TID WC & HS, Epifanio Lesches, MD, 1 g at 09/11/14 1718 .  vecuronium (NORCURON) 10 MG injection, , , ,   IMAGING    Dg Chest 1 View  09/12/2014   CLINICAL DATA:  Shortness of breath, respiratory distress, history COPD atrial fibrillation  EXAM: CHEST  1 VIEW  COMPARISON:  08/28/2014  FINDINGS: New patchy diffuse right lung airspace process concerning for developing right lung pneumonia. Minor left base atelectasis. Heart is enlarged. Mild central vascular congestion. No significant effusion or pneumothorax. Atherosclerosis of the aorta evident.  IMPRESSION: New patchy diffuse right lung airspace process concerning for pneumonia or aspiration.  Minimal left base atelectasis   Electronically Signed   By: Jerilynn Mages.  Shick M.D.   On: 09/12/2014 10:58      Indwelling Urinary Catheter continued, requirement due to   Reason to continue Indwelling Urinary Catheter for strict Intake/Output monitoring for hemodynamic instability   Central Line continued, requirement due to   Reason to continue [employer] Monitoring of central venous pressure or other hemodynamic parameters   Ventilator continued, requirement due to, resp failure    Ventilator Sedation RASS 0 to -2      ASSESSMENT/PLAN   81 yo white female transferred/admitted to ICU for acute and severe resp failure with hypoxia from acute aspiration pneumonia with acute COPD exacerbation with acute septic shock   PULMONARY -Respiratory Failure -continue Full MV support -start Bronchodilator Therapy -Wean Fio2 and PEEP as tolerated -will perform SAT/SBt when respiratory parameters are  met   CARDIOVASCULAR Septic shock -use vasopressors to keep MAP>65 -follow ABG and LA -follow up cultures -emperic ABX -consider stress dose steroids   RENAL -follow chem 7 -foley catheter -follow UO  GASTROINTESTINAL -Placed OGT  HEMATOLOGIC Follow h/h  INFECTIOUS -aspiration pneumonia -emperic abx -will perform Bronch obtain BAL  ENDOCRINE - ICU hypoglycemic\Hyperglycemia protocol   NEUROLOGIC - intubated and sedated - minimal sedation to achieve a RASS goal: -1   I have personally obtained a history, examined the patient, evaluated laboratory and independently reviewed  imaging results, formulated the assessment and plan and placed orders.  The Patient requires high complexity decision making for assessment and support, frequent evaluation and titration of therapies, application of advanced monitoring technologies and extensive interpretation of multiple databases. Critical Care Time devoted to patient care services described in this note is 55 minutes.   Overall, patient is critically ill, prognosis is guarded. Patient at high risk for cardiac arrest and death.    Corrin Parker, M.D.  [facility]  Medical Director [place] Director [facility] Cardio-Pulmonary Department

## 2014-09-12 NOTE — Consult Note (Signed)
Palliative Medicine Inpatient Consult Follow Up Note   Name: Tricia Miller Date: 09/11/2014 MRN: [id number]  DOB: 04-12-1929  Referring Physician: Epifanio Lesches, MD  Palliative Care consult requested for this 81 y.o. female for goals of medical therapy in patient with COPD, H/O Lymphoma (little hx known), pituitary MACROadenoma, falls, and pain on swallowing who was admitted with a COPD exacerbation and who is currently being worked up for this severe pain on swallowing that she is reporting to have.     REVIEW OF SYSTEMS:  Says she feels Ok but is tired and has pain on swallowing liquids or solids.  CODE STATUS: Currently FULL CODE   PAST MEDICAL HISTORY: Past Medical History  Diagnosis Date  . COPD (chronic obstructive pulmonary disease)   . Vertigo   . Barrett esophagus   . Lymphoma of lymph nodes   . A-fib   . Pituitary macroadenoma     PAST SURGICAL HISTORY:  Past Surgical History  Procedure Laterality Date  . Abdominal hysterectomy    . Pituitary surgery    . Orthopedic surgery    . Esophagogastroduodenoscopy (egd) with propofol Left 08/23/2014    Procedure: ESOPHAGOGASTRODUODENOSCOPY (EGD) WITH PROPOFOL with possible dilation;  Surgeon: Manya Silvas, MD;  Location: [facility] ENDOSCOPY;  Service: Endoscopy;  Laterality: Left;    Vital Signs: BP 102/60 mmHg  Pulse 100  Temp(Src) 98.6 F (37 C) (Oral)  Resp 20  Ht 4\' 11"  (1.499 m)  Wt 79.561 kg (175 lb 6.4 oz)  BMI 35.41 kg/m2  SpO2 92% Filed Weights   09/11/2014 1240 09/12/2014 2102  Weight: 80.695 kg (177 lb 14.4 oz) 79.561 kg (175 lb 6.4 oz)    Estimated body mass index is 35.41 kg/(m^2) as calculated from the following:   Height as of this encounter: 4\' 11"  (1.499 m).   Weight as of this encounter: 79.561 kg (175 lb 6.4 oz).  PHYSICAL EXAM: Sleeping with oxygen off --resting on her chest Wakened and O2 was repositioned in her nostrils EOMI OP clear Nares patent Neck no JVD or TM Heart rrr  no mgr Lungs cta -- some mild upper ronchi heard when she was sleeping Abd soft and Nontender Ext no cyanosis --moves all 4's Skin warm and dry  LABS: CBC:    Component Value Date/Time   WBC 11.4* 08/17/2014 0440   HGB 12.2 08/28/2014 0440   HCT 38.6 09/01/2014 0440   PLT 182 08/22/2014 0440   MCV 80.4 08/24/2014 0440   NEUTROABS 9.7* 08/17/2014 1451   LYMPHSABS 2.4 08/30/2014 1451   MONOABS 0.9 09/06/2014 1451   EOSABS 0.0 08/22/2014 1451   BASOSABS 0.1 08/22/2014 1451   Comprehensive Metabolic Panel:    Component Value Date/Time   NA 144 09/08/2014 0440   K 3.5 08/31/2014 0440   CL 107 09/08/2014 0440   CO2 27 09/06/2014 0440   BUN 28* 09/16/2014 0440   CREATININE 0.69 09/06/2014 0440   GLUCOSE 103* 09/09/2014 0440   CALCIUM 7.9* 09/10/2014 0440   AST 26 08/25/2014 1451   ALT 12* 09/01/2014 1451   ALKPHOS 101 09/02/2014 1451   BILITOT 0.8 09/05/2014 1451   PROT 6.0* 09/04/2014 1451   ALBUMIN 3.1* 09/06/2014 1451    IMPRESSION: 1)Advanced COPD --Had Pneumonia diagnosed on 6/27 in the ER but on return here on day of admission (7/2), she has only COPD but no infiltrate seen and no pneumonia diagnosed.  2)Macroadenoma  --with concerning CT findings here (optic nerve compression, fluid and blood ) --  however, thought to be w/o change from CT brain done at [facility] --notes say she and family had refused radiation due to potential side effects 3)Lymphoma  --nothing in the electronic medical record providing details --patient sleepy and not interactive today so will have to explore this history tomorrow 4) Pain on swallowing: --she had endoscopy showing NO STRICTURE (as she was thought to have) but did show: - LA Grade C reflux esophagitis. - Esophageal mucosal changes secondary to established short-segment Barrett's disease. - Large hiatus hernia. 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 Biopsied. - Duodenal lipoma. Biopsied. ---PPI and Carafate slurry was  ordered 5)Possible Dysphagia?   --Speech was consulted due to pateints complaints of pain on swallowing liquids or solids and this is what was noted by Speech earlier today:  Pt appeared to be able to swallow small amounts of liquid melted from ice chips and popcicle but was hampered by a severely sore throat which resulted in pt stating she "just could not swallow!" followed by gagging then coughing x1. Pt described that is "hurts so much when I try to swallow". This discomfort, talking, and coughing increased pt's respiratory effort. Pt was given time to calm her breathing. She continued to talk and describe many ailments and how she could not understand why she did not get better "this time". MD was present in the room; pt described to her the "pain" she had when she tried to swallow. MD changed her meds, including Diflucan, to IV and fluids started. Rec. no oral diet today and allow for meds to begin working, healing. Encouraged pt to take the ice chips as she could tolerate. ST will f/u tomorrow w/ further po trials. NSG present and agreed. MD agreed.       6)Constipation  --had diarrhea at home before coming in then no BM from 7/2 till recently (?) 7)Electrolyte Dissaray --treated 8)Weak and Dizzy and Nauseated  9)Afib- Paroxysmal  --had been on Xarelto  10)HTN 11)Hypothyroidsim 12)Pain and headaches 13)Depression treated with zoloft 14)Falls (sent to [place] for rehab after a fall and hosp stay at [facility])   PLAN: Patient is seen today to be in no distress.  She has actually taken her oxygen off and is not in distress.  i wakened her and had her put her oxygen back on.  I would like to discuss her code status (it had been changed recently to Full Code), but she is lethargic --with me just having wakened her, and though she is fully oriented, I did not feel this was the best time to address this.  Will visit again soon and resume supportive discussions.  Work up for swallowing difficulty  continues.    REFERRALS TO BE ORDERED: None at this time.   Time Spent: 30 minutes

## 2014-09-12 NOTE — Progress Notes (Signed)
ANTIBIOTIC CONSULT NOTE - INITIAL  Pharmacy Consult for Vancomycin/Zosyn Indication: pneumonia  Allergies  Allergen Reactions  . Bactrim [Sulfamethoxazole-Trimethoprim] Swelling  . Ciprofloxacin Swelling  . Pregabalin Swelling  . Felodipine     Other reaction(s): UNKNOWN  . Levaquin [Levofloxacin In D5w] Other (See Comments)    Unknown  . Trimethoprim Other (See Comments)    UNKNOWN   . Codeine Nausea Only  . Morphine Nausea Only  . Penicillins Rash    Patient Measurements: Height: 4\' 11"  (149.9 cm) Weight: 175 lb 6.4 oz (79.561 kg) IBW/kg (Calculated) : 43.2 Adjusted Body Weight: 59.1 kg  Vital Signs: Temp: 99.3 F (37.4 C) (07/27 1125) Temp Source: Axillary (07/27 1125) BP: 105/62 mmHg (07/27 1215) Pulse Rate: 108 (07/27 1220) Intake/Output from previous day: 07/26 0701 - 07/27 0700 In: 835 [I.V.:785; IV Piggyback:50] Out: 0  Intake/Output from this shift:    Labs:  Recent Labs  09/02/2014 1451 09/02/2014 0440  WBC 13.2* 11.4*  HGB 12.8 12.2  PLT 193 182  CREATININE 0.72 0.69   Estimated Creatinine Clearance: 49.5 mL/min (by C-G formula based on Cr of 0.69). No results for input(s): VANCOTROUGH, VANCOPEAK, VANCORANDOM, GENTTROUGH, GENTPEAK, GENTRANDOM, TOBRATROUGH, TOBRAPEAK, TOBRARND, AMIKACINPEAK, AMIKACINTROU, AMIKACIN in the last 72 hours.   Kinetics:   Ke: 0.046 Vd: 41.4    Microbiology: Recent Results (from the past 720 hour(s))  MRSA PCR Screening     Status: None   Collection Time: 09/07/2014 12:00 AM  Result Value Ref Range Status   MRSA by PCR NEGATIVE NEGATIVE Final    Comment:        The GeneXpert MRSA Assay (FDA approved for NASAL specimens only), is one component of a comprehensive MRSA colonization surveillance program. It is not intended to diagnose MRSA infection nor to guide or monitor treatment for MRSA infections.     Medical History: Past Medical History  Diagnosis Date  . COPD (chronic obstructive pulmonary disease)    . Vertigo   . Barrett esophagus   . Lymphoma of lymph nodes   . A-fib   . Pituitary macroadenoma     Medications:  Scheduled:  . albuterol  2.5 mg Nebulization Once  . budesonide (PULMICORT) nebulizer solution  0.5 mg Nebulization BID  . feeding supplement  1 Container Oral TID BM  . fluconazole (DIFLUCAN) IV  100 mg Intravenous Q24H  . hydrocortisone sod succinate (SOLU-CORTEF) inj  60 mg Intravenous Q6H  . ipratropium-albuterol  3 mL Nebulization Q4H  . nystatin  5 mL Oral QID  . pantoprazole (PROTONIX) IV  40 mg Intravenous Q12H  . piperacillin-tazobactam (ZOSYN)  IV  3.375 g Intravenous Q8H  . sucralfate  1 g Oral TID WC & HS  . vancomycin  750 mg Intravenous STAT  . vancomycin  750 mg Intravenous Q18H   Infusions:  . norepinephrine 15 mcg/min (09/12/14 1222)  . vasopressin (PITRESSIN) infusion - *FOR SHOCK*     PRN: fentaNYL (SUBLIMAZE) injection, midazolam, morphine injection, ondansetron (ZOFRAN) IV, promethazine, sodium chloride  Assessment: 81 y/o F ordered empiric abx for PNA.   Goal of Therapy:  Vancomycin trough level 15-20 mcg/ml  Plan:  1. Vancomycin 750 mg iv q 18 hours with stacked dosing and a trough with the 4th dose.   2. Zosyn 3.375 g EI q 8 h.   Will f/u renal function, levels, and culture results.   Ulice Dash D 09/12/2014,2:38 PM

## 2014-09-13 LAB — MAGNESIUM: MAGNESIUM: 1.1 mg/dL — AB (ref 1.7–2.4)

## 2014-09-13 LAB — BLOOD GAS, ARTERIAL
Acid-base deficit: 15.6 mmol/L — ABNORMAL HIGH (ref 0.0–2.0)
Acid-base deficit: 16.4 mmol/L — ABNORMAL HIGH (ref 0.0–2.0)
Allens test (pass/fail): POSITIVE — AB
Bicarbonate: 12.6 mEq/L — ABNORMAL LOW (ref 21.0–28.0)
Bicarbonate: 11.6 mEq/L — ABNORMAL LOW (ref 21.0–28.0)
FIO2: 0.65
FIO2: 0.8
LHR: 20 {breaths}/min
MECHANICAL RATE: 20
MECHVT: 500 mL
MECHVT: 500 mL
O2 SAT: 81.7 %
O2 Saturation: 78.4 %
PATIENT TEMPERATURE: 37
PCO2 ART: 34 mmHg (ref 32.0–48.0)
PEEP/CPAP: 5 cmH2O
PEEP: 5 cmH2O
PO2 ART: 57 mmHg — AB (ref 83.0–108.0)
Patient temperature: 37
RATE: 20 resp/min
pCO2 arterial: 37 mmHg (ref 32.0–48.0)
pH, Arterial: 7.14 — CL (ref 7.350–7.450)
pH, Arterial: 7.14 — CL (ref 7.350–7.450)
pO2, Arterial: 61 mmHg — ABNORMAL LOW (ref 83.0–108.0)

## 2014-09-13 LAB — CBC WITH DIFFERENTIAL/PLATELET
BASOS PCT: 0 % (ref 0–1)
BLASTS: 0 %
Band Neutrophils: 21 % — ABNORMAL HIGH (ref 0–10)
Basophils Absolute: 0 10*3/uL (ref 0.0–0.1)
Eosinophils Absolute: 0 10*3/uL (ref 0.0–0.7)
Eosinophils Relative: 0 % (ref 0–5)
HEMATOCRIT: 36.8 % (ref 35.0–47.0)
Hemoglobin: 11 g/dL — ABNORMAL LOW (ref 12.0–16.0)
LYMPHS ABS: 13.5 10*3/uL — AB (ref 0.7–4.0)
Lymphocytes Relative: 62 % — ABNORMAL HIGH (ref 12–46)
MCH: 25.1 pg — ABNORMAL LOW (ref 26.0–34.0)
MCHC: 29.7 g/dL — ABNORMAL LOW (ref 32.0–36.0)
MCV: 84.5 fL (ref 80.0–100.0)
MYELOCYTES: 0 %
Metamyelocytes Relative: 0 %
Monocytes Absolute: 1.1 10*3/uL — ABNORMAL HIGH (ref 0.1–1.0)
Monocytes Relative: 5 % (ref 3–12)
NEUTROS ABS: 7.2 10*3/uL (ref 1.7–7.7)
NEUTROS PCT: 12 % — AB (ref 43–77)
Other: 0 %
PROMYELOCYTES ABS: 0 %
Platelets: 174 10*3/uL (ref 150–440)
RBC: 4.36 MIL/uL (ref 3.80–5.20)
RDW: 21 % — AB (ref 11.5–14.5)
WBC: 21.8 10*3/uL — ABNORMAL HIGH (ref 3.6–11.0)
nRBC: 9 /100 WBC — ABNORMAL HIGH

## 2014-09-13 LAB — COMPREHENSIVE METABOLIC PANEL
ALT: 11 U/L — ABNORMAL LOW (ref 14–54)
ANION GAP: 16 — AB (ref 5–15)
AST: 47 U/L — AB (ref 15–41)
Albumin: 1.2 g/dL — ABNORMAL LOW (ref 3.5–5.0)
Alkaline Phosphatase: 39 U/L (ref 38–126)
BUN: 20 mg/dL (ref 6–20)
CALCIUM: 5.2 mg/dL — AB (ref 8.9–10.3)
CO2: 12 mmol/L — ABNORMAL LOW (ref 22–32)
Chloride: 120 mmol/L — ABNORMAL HIGH (ref 101–111)
Creatinine, Ser: 1.68 mg/dL — ABNORMAL HIGH (ref 0.44–1.00)
GFR calc non Af Amer: 27 mL/min — ABNORMAL LOW (ref >60)
GFR, EST AFRICAN AMERICAN: 32 mL/min — AB (ref >60)
GLUCOSE: 62 mg/dL — AB (ref 65–99)
Potassium: 2.8 mmol/L — CL (ref 3.5–5.1)
SODIUM: 148 mmol/L — AB (ref 135–145)
TOTAL PROTEIN: 3.2 g/dL — AB (ref 6.5–8.1)
Total Bilirubin: 0.9 mg/dL (ref 0.3–1.2)

## 2014-09-13 LAB — LACTIC ACID, PLASMA: LACTIC ACID, VENOUS: 15.5 mmol/L — AB (ref 0.5–2.0)

## 2014-09-13 LAB — PHOSPHORUS: PHOSPHORUS: 2.5 mg/dL (ref 2.5–4.6)

## 2014-09-13 LAB — PATHOLOGIST SMEAR REVIEW

## 2014-09-13 LAB — SURGICAL PATHOLOGY

## 2014-09-13 MED ORDER — SODIUM CHLORIDE 0.9 % IV SOLN
2.0000 g | Freq: Once | INTRAVENOUS | Status: AC
  Administered 2014-09-13: 2 g via INTRAVENOUS
  Filled 2014-09-13: qty 20

## 2014-09-13 MED ORDER — MORPHINE SULFATE (CONCENTRATE) 10 MG/0.5ML PO SOLN: 4.0000 mg | ORAL | Status: DC | PRN

## 2014-09-13 MED ORDER — MORPHINE SULFATE 4 MG/ML IJ SOLN
INTRAMUSCULAR | Status: AC
  Administered 2014-09-13: 4 mg via INTRAVENOUS
  Filled 2014-09-13: qty 1

## 2014-09-13 MED ORDER — MORPHINE SULFATE 4 MG/ML IJ SOLN
4.0000 mg | INTRAMUSCULAR | Status: DC | PRN
  Administered 2014-09-13: 4 mg via INTRAVENOUS

## 2014-09-13 MED ORDER — VANCOMYCIN HCL IN DEXTROSE 750-5 MG/150ML-% IV SOLN
750.0000 mg | INTRAVENOUS | Status: DC
  Filled 2014-09-13: qty 150

## 2014-09-13 MED ORDER — AMIODARONE HCL IN DEXTROSE 360-4.14 MG/200ML-% IV SOLN
60.0000 mg/h | INTRAVENOUS | Status: DC
  Administered 2014-09-13: 60 mg/h via INTRAVENOUS
  Filled 2014-09-13 (×2): qty 200

## 2014-09-13 MED ORDER — POTASSIUM CHLORIDE 10 MEQ/100ML IV SOLN
10.0000 meq | INTRAVENOUS | Status: AC
  Administered 2014-09-13 (×4): 10 meq via INTRAVENOUS
  Filled 2014-09-13 (×4): qty 100

## 2014-09-13 MED ORDER — AMIODARONE HCL IN DEXTROSE 360-4.14 MG/200ML-% IV SOLN
30.0000 mg/h | INTRAVENOUS | Status: DC
  Administered 2014-09-13: 59.94 mg/h via INTRAVENOUS
  Filled 2014-09-13 (×3): qty 200

## 2014-09-13 MED ORDER — CHLORHEXIDINE GLUCONATE 0.12 % MT SOLN
15.0000 mL | Freq: Two times a day (BID) | OROMUCOSAL | Status: DC
  Administered 2014-09-13: 15 mL via OROMUCOSAL

## 2014-09-13 MED ORDER — MAGNESIUM SULFATE 4 GM/100ML IV SOLN
4.0000 g | Freq: Once | INTRAVENOUS | Status: AC
  Administered 2014-09-13: 4 g via INTRAVENOUS
  Filled 2014-09-13: qty 100

## 2014-09-13 MED ORDER — CETYLPYRIDINIUM CHLORIDE 0.05 % MT LIQD
7.0000 mL | Freq: Four times a day (QID) | OROMUCOSAL | Status: DC
  Administered 2014-09-13: 7 mL via OROMUCOSAL

## 2014-09-15 LAB — CULTURE, BAL-QUANTITATIVE W GRAM STAIN

## 2014-09-15 LAB — CULTURE, BAL-QUANTITATIVE

## 2014-09-17 NOTE — Progress Notes (Signed)
Notified Dr Mortimer Fries of abg, cbc, metc results this am Dr Mortimer Fries stated to notify pharmacy and inform the pharmacist to manage pts electrolyte imbalances; also informed Dr Mortimer Fries pts abdomen increasingly distended with very hypoactive bowel sounds asked if he wanted to place an order to perform a CT scan of the abdomen and pelvis he stated pt is too unstable at this time and did not want to place an order for a CT of abdomen and pelvis; Dr Mortimer Fries also acknowledged abg results stated he would speak to pts. daughter about pt prognosis when she arrives at bedside no further orders given at this time

## 2014-09-17 NOTE — Progress Notes (Signed)
[place] [facility] [place] NAME: Tricia Miller    MR#:  [id number]  DATE OF BIRTH:  October 04, 1929  SUBJECTIVE: Transfer to ICU yesterday for respiratory failure. Right now she is on full vent support, requiring 2 pressors to keep the blood pressure she is on vasopressin, epinephrine. And she is also on amiodarone drip for her atrial fibrillation. Family decided to pursue comfort care.   CHIEF COMPLAINT:   Chief Complaint  Patient presents with  . Weakness    REVIEW OF SYSTEMS:    Review of Systems  Unable to perform ROS: acuity of condition  Constitutional: Negative for fever and chills.  HENT: Negative for hearing loss.   Eyes: Negative for blurred vision, double vision and photophobia.  Respiratory: Positive for shortness of breath. Negative for cough and hemoptysis.   Cardiovascular: Negative for palpitations, orthopnea and leg swelling.  Gastrointestinal: Positive for abdominal pain. Negative for vomiting and diarrhea.  Genitourinary: Negative for dysuria and urgency.  Musculoskeletal: Negative for myalgias and neck pain.  Skin: Negative for rash.  Neurological: Negative for dizziness, focal weakness, seizures, weakness and headaches.  Psychiatric/Behavioral: Negative for memory loss. The patient does not have insomnia.     Nutrition:  Tolerating Diet:no Tolerating PT:      DRUG ALLERGIES:   Allergies  Allergen Reactions  . Bactrim [Sulfamethoxazole-Trimethoprim] Swelling  . Ciprofloxacin Swelling  . Pregabalin Swelling  . Felodipine     Other reaction(s): UNKNOWN  . Levaquin [Levofloxacin In D5w] Other (See Comments)    Unknown  . Trimethoprim Other (See Comments)    UNKNOWN   . Codeine Nausea Only  . Morphine Nausea Only  . Penicillins Rash    VITALS:  Blood pressure 91/56, pulse 51, temperature 100.7 F (38.2 C), temperature source Oral, resp. rate 20, height 4\' 11"  (1.499 m), weight 79.561 kg (175 lb 6.4  oz), SpO2 96 %.  PHYSICAL EXAMINATION:   Physical Exam  GENERAL:  81 y.o.-year-old patient lying in the bed ,critically ill. reactive to light and accommodation. No scleral icterus.   HEENT: Head atraumatic, normocephalic. Orally intubated. NECK:  Supple, no jugular venous distention. No thyroid enlargement, no tenderness.  LUNGS ; coarse breath sounds bilaterally.Marland Kitchen CARDIOVASCULAR: S1, S2 normal. No murmurs, rubs, or gallops.  ABDOMEN: Distended, tender to palpation. Bowel Sounds .: No pedal edema, cyanosis, or clubbing.   NEUROLOGIC: Cranial nerves II through XII are intact. Muscle strength 5/5 in all extremities. Sensation intact. Gait not checked.  PSYCHIATRIC: The patient is alert and oriented x 3.  SKIN: No obvious rash, lesion, or ulcer.    LABORATORY PANEL:   CBC  Recent Labs Lab 09-25-14 0814  WBC 21.8*  HGB 11.0*  HCT 36.8  PLT 174   ------------------------------------------------------------------------------------------------------------------  Chemistries   Recent Labs Lab 09/25/14 0814  NA 148*  K 2.8*  CL 120*  CO2 12*  GLUCOSE 62*  BUN 20  CREATININE 1.68*  CALCIUM 5.2*  MG 1.1*  AST 47*  ALT 11*  ALKPHOS 39  BILITOT 0.9   ------------------------------------------------------------------------------------------------------------------  Cardiac Enzymes  Recent Labs Lab 08/27/2014 0440  TROPONINI 0.03   ------------------------------------------------------------------------------------------------------------------  RADIOLOGY:  Dg Chest 1 View  09/12/2014   CLINICAL DATA:  Intubated.  Line placement.  EXAM: CHEST  1 VIEW  COMPARISON:  09/12/2014  FINDINGS: endotracheal tube is 2.6 cm above the carina. Left central line tip is in the SVC. No pneumothorax. Diffuse right lung airspace disease again noted, slightly more confluent  in the right lower lobe since prior study. Minimal left base atelectasis. Heart is borderline in size. Possible  small right effusion. No acute bony abnormality.  IMPRESSION: Endotracheal tube 2.6 cm above the carina. Left central line tip in the SVC.  Patchy right lung airspace disease worsening since prior study.   Electronically Signed   By: Rolm Baptise M.D.   On: 09/12/2014 12:34   Dg Chest 1 View  09/12/2014   CLINICAL DATA:  Shortness of breath, respiratory distress, history COPD atrial fibrillation  EXAM: CHEST  1 VIEW  COMPARISON:  08/22/2014  FINDINGS: New patchy diffuse right lung airspace process concerning for developing right lung pneumonia. Minor left base atelectasis. Heart is enlarged. Mild central vascular congestion. No significant effusion or pneumothorax. Atherosclerosis of the aorta evident.  IMPRESSION: New patchy diffuse right lung airspace process concerning for pneumonia or aspiration.  Minimal left base atelectasis   Electronically Signed   By: Jerilynn Mages.  Shick M.D.   On: 09/12/2014 10:58   Dg Abd 1 View  09/12/2014   CLINICAL DATA:  NG tube placement  EXAM: ABDOMEN - 1 VIEW  COMPARISON:  09/05/2014  FINDINGS: NG tube is in place with the tip in the distal stomach. Mild gaseous distention of the colon. No evidence of bowel obstruction.  IMPRESSION: NG tube tip in the distal stomach.   Electronically Signed   By: Rolm Baptise M.D.   On: 09/12/2014 12:34     ASSESSMENT AND PLAN:   Principal Problem:   Nausea and vomiting Active Problems:   Dysphagia   Dehydration   Acute respiratory failure   Aspiration pneumonia   COPD exacerbation   Septic shock  #1 acute respiratory failure likely secondary to aspiration pneumonia status post bronchoscopy with mucous plug removal and the cultures from the BAL showed MRSA, gram-negative rods. She is on isolation for that, continue Zosyn. Family decided about comfort care. [place] terminal weaning today. #2 multiorgan failure: Patient renal function is worsening with acidosis, respiratory failure, possible septic shock.  #3 hypokalemia replace the  electrolytes as per cc protocol atrial fibrillation with RVR continue amiodarone drip. NHistory of microadenoma tatient family chose not to get any radiation therapy.  All the records are reviewed and case discussed with Care Management/Social Workerr. Management plans discussed with the patient, family and they are in agreement.  CODE STATUS: full  TOTAL TIME TAKING CARE OF THIS PATIENT: 35 minutes.   POSSIBLE D/C IN 1-2  DAYS, DEPENDING ON CLINICAL CONDITION.   Epifanio Lesches M.D on 22-Sep-2014 at 11:18 AM  Between 7am to 6pm - Pager - [phone]  After 6pm go to [email or web address] - password [email or web address] [facility]  Office  [phone]  CC: Primary care physician; Kingsley Callander, MD

## 2014-09-17 NOTE — Consult Note (Signed)
PULMONARY / CRITICAL CARE MEDICINE   Name: Tricia Miller MRN: [id number] DOB: 07-Dec-1929    ADMISSION DATE:  08/21/2014   CHIEF COMPLAINT:    follow acute SOB   HISTORY OF PRESENT ILLNESS  Patient remains intubated,sedated On 2 vasopressors, severe metabolic acidosis with acute renal failure Showing signs of cyanosis, LA is elevated depsite aggressive fluids  fio2 at 80%  Patient with multiorgan failure, prognosis is very poor     PAST MEDICAL HISTORY    :  Past Medical History  Diagnosis Date  . COPD (chronic obstructive pulmonary disease)   . Vertigo   . Barrett esophagus   . Lymphoma of lymph nodes   . A-fib   . Pituitary macroadenoma    Past Surgical History  Procedure Laterality Date  . Abdominal hysterectomy    . Pituitary surgery    . Orthopedic surgery    . Esophagogastroduodenoscopy (egd) with propofol Left 09/06/2014    Procedure: ESOPHAGOGASTRODUODENOSCOPY (EGD) WITH PROPOFOL with possible dilation;  Surgeon: Manya Silvas, MD;  Location: [facility] ENDOSCOPY;  Service: Endoscopy;  Laterality: Left;   Prior to Admission medications   Medication Sig Start Date End Date Taking? Authorizing Provider  acetaminophen (TYLENOL) 325 MG tablet Take 2 tablets (650 mg total) by mouth every 6 (six) hours as needed for mild pain (or Fever >/= 101). 08/24/14  Yes Nicholes Mango, MD  albuterol (PROVENTIL HFA;VENTOLIN HFA) 108 (90 BASE) MCG/ACT inhaler Inhale 2 puffs into the lungs every 4 (four) hours as needed for wheezing or shortness of breath.   Yes Historical Provider, MD  albuterol (PROVENTIL) (2.5 MG/3ML) 0.083% nebulizer solution Take 2.5 mg by nebulization every 4 (four) hours as needed for wheezing or shortness of breath.   Yes Historical Provider, MD  bisacodyl (DULCOLAX) 5 MG EC tablet Take 2 tablets (10 mg total) by mouth daily. 08/24/14  Yes Nicholes Mango, MD  clonazePAM (KLONOPIN) 0.5 MG tablet Take 0.25 mg by mouth every 12 (twelve) hours.   Yes Historical  Provider, MD  diclofenac sodium (VOLTAREN) 1 % GEL Apply 2 g topically 4 (four) times daily.   Yes Historical Provider, MD  fluticasone (FLONASE) 50 MCG/ACT nasal spray Place 2 sprays into both nostrils daily.   Yes Historical Provider, MD  furosemide (LASIX) 20 MG tablet Take 20 mg by mouth daily as needed for edema.   Yes Historical Provider, MD  ipratropium-albuterol (DUONEB) 0.5-2.5 (3) MG/3ML SOLN Take 3 mLs by nebulization every 6 (six) hours as needed. 08/24/14  Yes Nicholes Mango, MD  levothyroxine (SYNTHROID, LEVOTHROID) 50 MCG tablet Take 50 mcg by mouth every morning.   Yes Historical Provider, MD  magnesium oxide (MAG-OX) 400 MG tablet Take 400 mg by mouth daily.   Yes Historical Provider, MD  meclizine (ANTIVERT) 25 MG tablet Take 50 mg by mouth 3 (three) times daily.   Yes Historical Provider, MD  nystatin (MYCOSTATIN) 100000 UNIT/ML suspension Take 5 mLs by mouth 4 (four) times daily.   Yes Historical Provider, MD  omeprazole (PRILOSEC) 40 MG capsule Take 40 mg by mouth every morning.   Yes Historical Provider, MD  OxyCODONE HCl, Abuse Deter, (OXAYDO) 5 MG TABA Take 2.5 mg by mouth every 4 (four) hours as needed (for pain.).   Yes Historical Provider, MD  potassium chloride (KLOR-CON) 20 MEQ packet Take 20 mEq by mouth daily.   Yes Historical Provider, MD  promethazine (PHENERGAN) 12.5 MG tablet Take 12.5 mg by mouth 4 (four) times daily.   Yes Historical  Provider, MD  promethazine (PHENERGAN) 25 MG/ML injection Inject 25 mg into the vein every 6 (six) hours as needed for nausea or vomiting.   Yes Historical Provider, MD  scopolamine (TRANSDERM-SCOP, 1.5 MG,) 1 MG/3DAYS Place 1 patch onto the skin every 3 (three) days.   Yes Historical Provider, MD  sertraline (ZOLOFT) 100 MG tablet Take 100 mg by mouth daily.    Yes Historical Provider, MD  triamcinolone (KENALOG) 0.025 % cream Apply 1 application topically 2 (two) times daily.   Yes Historical Provider, MD  potassium chloride (KLOR-CON)  20 MEQ packet Take 20 mEq by mouth daily. 08/24/14 09/03/14  Nicholes Mango, MD   Allergies  Allergen Reactions  . Bactrim [Sulfamethoxazole-Trimethoprim] Swelling  . Ciprofloxacin Swelling  . Pregabalin Swelling  . Felodipine     Other reaction(s): UNKNOWN  . Levaquin [Levofloxacin In D5w] Other (See Comments)    Unknown  . Trimethoprim Other (See Comments)    UNKNOWN   . Codeine Nausea Only  . Morphine Nausea Only  . Penicillins Rash     FAMILY HISTORY   Family History  Problem Relation Age of Onset  . Diabetes Neg Hx   . Colon cancer Neg Hx   . Liver disease Neg Hx   . Coronary artery disease Mother   . Hypertension Father   . Stroke Father       SOCIAL HISTORY    reports that she has quit smoking. She does not have any smokeless tobacco history on file. She reports that she does not drink alcohol or use illicit drugs.  Review of Systems  Unable to perform ROS: critical illness      VITAL SIGNS    Temp:  [98.2 F (36.8 C)-100.7 F (38.2 C)] 100.7 F (38.2 C) (07/28 0800) Pulse Rate:  [46-136] 136 (07/28 0700) Resp:  [19-25] 19 (07/28 0830) BP: (66-131)/(38-68) 110/38 mmHg (07/28 0800) SpO2:  [64 %-100 %] 96 % (07/28 0700) Arterial Line BP: (69-174)/(29-106) 116/42 mmHg (07/28 0830) FiO2 (%):  [60 %-100 %] 80 % (07/28 0815) HEMODYNAMICS: CVP:  [3 mmHg-6 mmHg] 6 mmHg VENTILATOR SETTINGS: Vent Mode:  [-] PRVC FiO2 (%):  [60 %-100 %] 80 % Set Rate:  [20 bmp] 20 bmp Vt Set:  [500 mL] 500 mL PEEP:  [5 cmH20] 5 cmH20 INTAKE / OUTPUT:  Intake/Output Summary (Last 24 hours) at Oct 02, 2014 0920 Last data filed at Oct 02, 2014 0600  Gross per 24 hour  Intake  124.3 ml  Output   1100 ml  Net -975.7 ml       PHYSICAL EXAM   Physical Exam  Constitutional: She appears distressed.  HENT:  Head: Normocephalic and atraumatic.  Eyes: Pupils are equal, round, and reactive to light. No scleral icterus.  Neck: Normal range of motion. Neck supple.  Cardiovascular:  Normal rate and regular rhythm.   No murmur heard. Pulmonary/Chest: She is in respiratory distress. She has no wheezes. She has rales.  resp distress  Abdominal: Soft. She exhibits no distension. There is no tenderness.  Musculoskeletal: She exhibits no edema.  Neurological: She displays normal reflexes. Coordination normal.  gcs<8T  Skin: Skin is warm. No rash noted. She is diaphoretic.       LABS   LABS:  CBC  Recent Labs Lab 09/14/2014 1451 09/08/2014 0440 October 02, 2014 0814  WBC 13.2* 11.4* 21.8*  HGB 12.8 12.2 11.0*  HCT 41.7 38.6 36.8  PLT 193 182 174   Coag's No results for input(s): APTT, INR in the last 168  hours. BMET  Recent Labs Lab 08/21/2014 1451 08/31/2014 0440 Oct 12, 2014 0814  NA 142 144 148*  K 2.8* 3.5 2.8*  CL 105 107 120*  CO2 22 27 12*  BUN 28* 28* 20  CREATININE 0.72 0.69 1.68*  GLUCOSE 104* 103* 62*   Electrolytes  Recent Labs Lab 08/19/2014 1451 09/08/2014 0440 10-12-2014 0814  CALCIUM 8.0* 7.9* 5.2*  MG 2.0  --  1.1*  PHOS  --   --  2.5   Sepsis Markers  Recent Labs Lab 08/20/2014 1451 08/21/2014 2016 09/12/14 1830  LATICACIDVEN 1.4 1.3 7.3*   ABG  Recent Labs Lab 09/12/14 1250 09/12/14 1740 12-Oct-2014 0752  PHART 7.23* 7.14* 7.14*  PCO2ART 35 37 34  PO2ART 133* 57* 61*   Liver Enzymes  Recent Labs Lab 09/03/2014 1451 10-12-2014 0814  AST 26 47*  ALT 12* 11*  ALKPHOS 101 39  BILITOT 0.8 0.9  ALBUMIN 3.1* 1.2*   Cardiac Enzymes  Recent Labs Lab 08/25/2014 2016 09/03/2014 2312 09/04/2014 0440  TROPONINI 0.03 0.04* 0.03   Glucose  Recent Labs Lab 09/11/14 2135 09/12/14 1117  GLUCAP 134* 132*     Recent Results (from the past 240 hour(s))  MRSA PCR Screening     Status: None   Collection Time: 09/08/2014 12:00 AM  Result Value Ref Range Status   MRSA by PCR NEGATIVE NEGATIVE Final    Comment:        The GeneXpert MRSA Assay (FDA approved for NASAL specimens only), is one component of a comprehensive MRSA  colonization surveillance program. It is not intended to diagnose MRSA infection nor to guide or monitor treatment for MRSA infections.   MRSA PCR Screening     Status: Abnormal   Collection Time: 09/12/14  1:15 PM  Result Value Ref Range Status   MRSA by PCR POSITIVE (A) NEGATIVE Final    Comment: CRITICAL RESULT CALLED TO, READ BACK BY AND VERIFIED WITH: WITH DANA BLAKENEY @ 2409 09/12/14 .Marland KitchenMarland KitchenSMG        The GeneXpert MRSA Assay (FDA approved for NASAL specimens only), is one component of a comprehensive MRSA colonization surveillance program. It is not intended to diagnose MRSA infection nor to guide or monitor treatment for MRSA infections.      Current facility-administered medications:  .  Place/Maintain arterial line, , , Until Discontinued **AND** 0.9 %  sodium chloride infusion, , Intra-arterial, PRN, Flora Lipps, MD .  amiodarone (NEXTERONE PREMIX) 360 MG/200ML (1.8 mg/mL) IV infusion, 60 mg/hr, Intravenous, Continuous, Flora Lipps, MD, Last Rate: 33.3 mL/hr at October 12, 2014 0741, 60 mg/hr at 10/12/2014 0741 .  amiodarone (NEXTERONE PREMIX) 360 MG/200ML (1.8 mg/mL) IV infusion, 30 mg/hr, Intravenous, Continuous, Flora Lipps, MD .  budesonide (PULMICORT) nebulizer solution 0.5 mg, 0.5 mg, Nebulization, BID, Flora Lipps, MD, 0.5 mg at 10/12/2014 0813 .  Chlorhexidine Gluconate Cloth 2 % PADS 6 each, 6 each, Topical, Q0600, Flora Lipps, MD, 6 each at 10/12/14 0557 .  fentaNYL (SUBLIMAZE) injection 100 mcg, 100 mcg, Intravenous, Q1H PRN, Flora Lipps, MD, 100 mcg at 09/12/14 1743 .  fentaNYL 2527mcg in NS 259mL (56mcg/ml) infusion-PREMIX, 10 mcg/hr, Intravenous, Continuous, Flora Lipps, MD, Last Rate: 10 mL/hr at Oct 12, 2014 0600, 100 mcg/hr at 2014-10-12 0600 .  fluconazole (DIFLUCAN) IVPB 100 mg, 100 mg, Intravenous, Q24H, Epifanio Lesches, MD, 100 mg at 09/12/14 1301 .  hydrocortisone sodium succinate (SOLU-CORTEF) 100 MG injection 60 mg, 60 mg, Intravenous, Q6H, Epifanio Lesches,  MD, 60 mg at Oct 12, 2014 0741 .  ipratropium-albuterol (DUONEB)  0.5-2.5 (3) MG/3ML nebulizer solution 3 mL, 3 mL, Nebulization, Q4H, Flora Lipps, MD, 3 mL at 09/27/2014 0813 .  midazolam (VERSED) injection 4 mg, 4 mg, Intravenous, Q1H PRN, Flora Lipps, MD, 2 mg at 09-27-2014 0605 .  morphine 2 MG/ML injection 2 mg, 2 mg, Intravenous, Q4H PRN, Epifanio Lesches, MD .  mupirocin ointment (BACTROBAN) 2 % 1 application, 1 application, Nasal, BID, Flora Lipps, MD, 1 application at 16/10/96 2156 .  norepinephrine (LEVOPHED) 16 mg in dextrose 5 % 250 mL (0.064 mg/mL) infusion, 0-40 mcg/min, Intravenous, Titrated, Flora Lipps, MD, Last Rate: 46.9 mL/hr at 09-27-2014 0828, 50 mcg/min at 2014-09-27 0828 .  nystatin (MYCOSTATIN) 100000 UNIT/ML suspension 500,000 Units, 5 mL, Oral, QID, Manya Silvas, MD, 500,000 Units at 09/12/14 1816 .  ondansetron (ZOFRAN) injection 4 mg, 4 mg, Intravenous, Q6H PRN, Vaughan Basta, MD, 4 mg at 09/12/14 0817 .  pantoprazole (PROTONIX) injection 40 mg, 40 mg, Intravenous, Q12H, Vaughan Basta, MD, 40 mg at 09/12/14 2157 .  piperacillin-tazobactam (ZOSYN) IVPB 3.375 g, 3.375 g, Intravenous, Q8H, Epifanio Lesches, MD, 3.375 g at 09/27/14 0557 .  promethazine (PHENERGAN) injection 12.5 mg, 12.5 mg, Intravenous, Q6H PRN, Epifanio Lesches, MD, 12.5 mg at 09/12/14 0430 .  sodium bicarbonate 150 mEq in sterile water 1,000 mL infusion, , Intravenous, Continuous, Flora Lipps, MD, Last Rate: 100 mL/hr at 09/27/14 0600 .  sodium chloride 0.9 % injection 3 mL, 3 mL, Intravenous, PRN, Vaughan Basta, MD .  vancomycin (VANCOCIN) IVPB 750 mg/150 ml premix, 750 mg, Intravenous, Q18H, Epifanio Lesches, MD, 750 mg at 09/12/14 2053 .  vasopressin (PITRESSIN) 40 Units in sodium chloride 0.9 % 250 mL (0.16 Units/mL) infusion, 0.03 Units/min, Intravenous, Continuous, Flora Lipps, MD, Last Rate: 11.3 mL/hr at 09/12/14 1441, 0.03 Units/min at 09/12/14 1441  IMAGING    Dg  Chest 1 View  09/12/2014   CLINICAL DATA:  Intubated.  Line placement.  EXAM: CHEST  1 VIEW  COMPARISON:  09/12/2014  FINDINGS: endotracheal tube is 2.6 cm above the carina. Left central line tip is in the SVC. No pneumothorax. Diffuse right lung airspace disease again noted, slightly more confluent in the right lower lobe since prior study. Minimal left base atelectasis. Heart is borderline in size. Possible small right effusion. No acute bony abnormality.  IMPRESSION: Endotracheal tube 2.6 cm above the carina. Left central line tip in the SVC.  Patchy right lung airspace disease worsening since prior study.   Electronically Signed   By: Rolm Baptise M.D.   On: 09/12/2014 12:34   Dg Chest 1 View  09/12/2014   CLINICAL DATA:  Shortness of breath, respiratory distress, history COPD atrial fibrillation  EXAM: CHEST  1 VIEW  COMPARISON:  09/12/2014  FINDINGS: New patchy diffuse right lung airspace process concerning for developing right lung pneumonia. Minor left base atelectasis. Heart is enlarged. Mild central vascular congestion. No significant effusion or pneumothorax. Atherosclerosis of the aorta evident.  IMPRESSION: New patchy diffuse right lung airspace process concerning for pneumonia or aspiration.  Minimal left base atelectasis   Electronically Signed   By: Jerilynn Mages.  Shick M.D.   On: 09/12/2014 10:58   Dg Abd 1 View  09/12/2014   CLINICAL DATA:  NG tube placement  EXAM: ABDOMEN - 1 VIEW  COMPARISON:  08/20/2014  FINDINGS: NG tube is in place with the tip in the distal stomach. Mild gaseous distention of the colon. No evidence of bowel obstruction.  IMPRESSION: NG tube tip in the distal stomach.  Electronically Signed   By: Rolm Baptise M.D.   On: 09/12/2014 12:34      Indwelling Urinary Catheter continued, requirement due to   Reason to continue Indwelling Urinary Catheter for strict Intake/Output monitoring for hemodynamic instability   Central Line continued, requirement due to   Reason to  continue [employer] Monitoring of central venous pressure or other hemodynamic parameters   Ventilator continued, requirement due to, resp failure    Ventilator Sedation RASS 0 to -2      ASSESSMENT/PLAN   81 yo white female transferred/admitted to ICU for acute and severe resp failure with hypoxia from acute aspiration pneumonia with acute COPD exacerbation with acute septic shock   PULMONARY -Respiratory Failure -continue Full MV support -started Bronchodilator Therapy -Wean Fio2 and PEEP as tolerated  CARDIOVASCULAR -Septic shock -use vasopressors to keep MAP>65 -follow ABG and LA -follow up cultures -emperic ABX -stress dose steroids   RENAL -follow chem 7 -foley catheter -follow UO -acute renal failure  With acidosis-will need CRRT but will discuss with family  GASTROINTESTINAL -Placed OGT  HEMATOLOGIC Follow h/h  INFECTIOUS -aspiration pneumonia -emperic abx -follow up BAL cultures  ENDOCRINE - ICU hypoglycemic\Hyperglycemia protocol   NEUROLOGIC - intubated and sedated - minimal sedation to achieve a RASS goal: -1   I have personally obtained a history, examined the patient, evaluated laboratory and independently reviewed  imaging results, formulated the assessment and plan and placed orders.  The Patient requires high complexity decision making for assessment and support, frequent evaluation and titration of therapies, application of advanced monitoring technologies and extensive interpretation of multiple databases. Critical Care Time devoted to patient care services described in this note is 55 minutes.   Overall, patient is critically ill, prognosis is guarded. Patient at high risk for cardiac arrest and death.    Corrin Parker, M.D.  [facility]  Medical Director [place] Director [facility] Cardio-Pulmonary Department

## 2014-09-17 NOTE — Progress Notes (Signed)
ANTIBIOTIC CONSULT NOTE - FOLLOW UP  Pharmacy Consult for Vancomycin/Zosyn Indication: pneumonia  Allergies  Allergen Reactions  . Bactrim [Sulfamethoxazole-Trimethoprim] Swelling  . Ciprofloxacin Swelling  . Pregabalin Swelling  . Felodipine     Other reaction(s): UNKNOWN  . Levaquin [Levofloxacin In D5w] Other (See Comments)    Unknown  . Trimethoprim Other (See Comments)    UNKNOWN   . Codeine Nausea Only  . Morphine Nausea Only  . Penicillins Rash    Patient Measurements: Height: 4\' 11"  (149.9 cm) Weight: 175 lb 6.4 oz (79.561 kg) IBW/kg (Calculated) : 43.2 Adjusted Body Weight: 59.1 kg  Vital Signs: Temp: 100.7 F (38.2 C) (07/28 0800) Temp Source: Oral (07/28 0800) BP: 91/56 mmHg (07/28 1100) Pulse Rate: 51 (07/28 0900) Intake/Output from previous day: 07/27 0701 - 07/28 0700 In: 124.3 [I.V.:124.3] Out: 1100 [Urine:1100] Intake/Output from this shift:    Labs:  Recent Labs  Oct 08, 2014 0814  WBC 21.8*  HGB 11.0*  PLT 174  CREATININE 1.68*   Estimated Creatinine Clearance: 23.6 mL/min (by C-G formula based on Cr of 1.68). No results for input(s): VANCOTROUGH, VANCOPEAK, VANCORANDOM, GENTTROUGH, GENTPEAK, GENTRANDOM, TOBRATROUGH, TOBRAPEAK, TOBRARND, AMIKACINPEAK, AMIKACINTROU, AMIKACIN in the last 72 hours.   Microbiology: Recent Results (from the past 720 hour(s))  MRSA PCR Screening     Status: None   Collection Time: 08/18/2014 12:00 AM  Result Value Ref Range Status   MRSA by PCR NEGATIVE NEGATIVE Final    Comment:        The GeneXpert MRSA Assay (FDA approved for NASAL specimens only), is one component of a comprehensive MRSA colonization surveillance program. It is not intended to diagnose MRSA infection nor to guide or monitor treatment for MRSA infections.   MRSA PCR Screening     Status: Abnormal   Collection Time: 09/12/14  1:15 PM  Result Value Ref Range Status   MRSA by PCR POSITIVE (A) NEGATIVE Final    Comment: CRITICAL RESULT  CALLED TO, READ BACK BY AND VERIFIED WITH: WITH DANA BLAKENEY @ 9562 09/12/14 .Marland KitchenMarland KitchenSMG        The GeneXpert MRSA Assay (FDA approved for NASAL specimens only), is one component of a comprehensive MRSA colonization surveillance program. It is not intended to diagnose MRSA infection nor to guide or monitor treatment for MRSA infections.   Culture, bal-quantitative     Status: None (Preliminary result)   Collection Time: 09/12/14  2:24 PM  Result Value Ref Range Status   Specimen Description BRONCHIAL ALVEOLAR LAVAGE  Final   Special Requests NONE  Final   Gram Stain PENDING  Incomplete   Culture   Final    HEAVY GROWTH STAPHYLOCOCCUS AUREUS MODERATE GROWTH GRAM NEGATIVE RODS IDENTIFICATION AND SUSCEPTIBILITIES TO FOLLOW    Report Status PENDING  Incomplete    Anti-infectives    Start     Dose/Rate Route Frequency Ordered Stop   09/14/14 0853  vancomycin (VANCOCIN) IVPB 750 mg/150 ml premix     750 mg 150 mL/hr over 60 Minutes Intravenous Every 36 hours October 08, 2014 1137     09/12/14 2130  vancomycin (VANCOCIN) IVPB 750 mg/150 ml premix  Status:  Discontinued     750 mg 150 mL/hr over 60 Minutes Intravenous Every 18 hours 09/12/14 1435 10-08-2014 1137   09/12/14 1500  piperacillin-tazobactam (ZOSYN) IVPB 3.375 g     3.375 g 12.5 mL/hr over 240 Minutes Intravenous Every 8 hours 09/12/14 1436     09/12/14 1445  vancomycin (VANCOCIN) IVPB 750 mg/150 ml premix  750 mg 150 mL/hr over 60 Minutes Intravenous STAT 09/12/14 1435 09/12/14 1610   09/12/14 0500  piperacillin-tazobactam (ZOSYN) IVPB 3.375 g  Status:  Discontinued     3.375 g 12.5 mL/hr over 240 Minutes Intravenous Every 8 hours 09/12/14 1435 09/12/14 1436   09/11/14 1245  fluconazole (DIFLUCAN) IVPB 100 mg     100 mg 50 mL/hr over 60 Minutes Intravenous Every 24 hours 09/11/14 1241        Assessment: 81 y/o F who is intubated, sedated, and on pressors ordered empiric abx for PNA.    Goal of Therapy:  Vancomycin  trough level 15-20 mcg/ml  Plan:  Due to worsening SCr, will change vancomycin dosing to 750 mg iv q 36 h. Continue Zosyn as ordered. Will f/u renal function and culture results and will check vancomycin level as clinically indicated.   Tricia Miller October 11, 2014,11:38 AM

## 2014-09-17 NOTE — Progress Notes (Signed)
   Oct 01, 2014 1525  Clinical Encounter Type  Visited With Family  Visit Type Spiritual support  Referral From Chaplain  Consult/Referral To Chaplain  Spiritual Encounters  Spiritual Needs Emotional  Stress Factors  Family Stress Factors Loss  Chaplain was contacted to support and relieve on-call chaplain and provide comfort and support to the family after patient died. Provided comfort and a compassionate presence. Chaplain Maeven Mcdougall A. Devaun Hernandez Ext. [phone]

## 2014-09-17 NOTE — Discharge Summary (Signed)
    Tricia Miller, is a 81 y.o. female  DOB 11/26/1929  MRN [id number].  Admission date:  09/08/2014  Admitting Physician  Vaughan Basta, MD  Discharge Date:  09/14/14   Primary MD  Kingsley Callander, MD  Recommendations for primary care physician for things to follow:  Patient expired on July 28 at 15:13   Admission Diagnosis  Nausea and vomiting, vomiting of unspecified type [R11.2]   Discharge Diagnosis  Nausea and vomiting, vomiting of unspecified type [R11.2]    Principal Problem:   Nausea and vomiting Active Problems:   Dysphagia   Dehydration   Acute respiratory failure   Aspiration pneumonia   COPD exacerbation   Septic shock      Past Medical History  Diagnosis Date  . COPD (chronic obstructive pulmonary disease)   . Vertigo   . Barrett esophagus   . Lymphoma of lymph nodes   . A-fib   . Pituitary macroadenoma     Past Surgical History  Procedure Laterality Date  . Abdominal hysterectomy    . Pituitary surgery    . Orthopedic surgery    . Esophagogastroduodenoscopy (egd) with propofol Left 09/06/2014    Procedure: ESOPHAGOGASTRODUODENOSCOPY (EGD) WITH PROPOFOL with possible dilation;  Surgeon: Manya Silvas, MD;  Location: [facility] ENDOSCOPY;  Service: Endoscopy;  Laterality: Left;       History of present illness and  Hospital Course:     Kindly see H&P for history of present illness and admission details, please review complete Labs, Consult reports and Test reports for all details in brief  HPI  from the history and physical done on the day of admission 81 year old female patient waited on July 24 for dysphagia decreased by mouth intake.   Hospital Course  1 dysphagia patient previous barium swallow showed a possible stricture patient was admitted to medical service GI  was consulted Dr. Vira Agar has seen the patient patient did have EGD done EGD showed inflammation and Barrett's esophagus but no stricture. Patient started on clear liquids. Seen by speech therapy , pt  was not able to swallow so we started on IV Diflucan and continued on IV fluids. Dr. Vira Agar suggested MBS from speech. However patient condition deteriorated and she had acute respiratory failure and had to be admitted to intensive care unit and the she was started on mechanical ventilation. #2 acute respiratory failure due to aspiration pneumonia .patient was urgently transferred to ICU for respiratory distress and intubated. A shunt had bronchoscopy and mucous plug was removed. And BAL was positive for MRSA,. She was started on vanco and zosyn. Patient might be experiencing some silent aspiration before this event. Patient also had some septic shock. And had to go on 2 pressors, and she was on amiodarone for A. fib with RVR. She also developed the worsening renal failure. Due to  multiorgan failure Palliative care has followed the patient and she met with patient's daughters and the explain her situation the family decided to terminally extubate and pursue comfort care orders. Patient expired at 313 PM.       Total time spent more than 30 minutes

## 2014-09-17 NOTE — Care Management Important Message (Signed)
Important Message  Patient Details  Name: Tricia Miller MRN: [id number] Date of Birth: Feb 24, 1929   Medicare Important Message Given:  Yes-second notification given    Marshell Garfinkel, RN September 27, 2014, 10:28 AM

## 2014-09-17 NOTE — Progress Notes (Signed)
PARENTERAL NUTRITION CONSULT NOTE - INITIAL  Pharmacy Consult for Electrolyte Mgmt Indication: Low Electrolytes  Allergies  Allergen Reactions  . Bactrim [Sulfamethoxazole-Trimethoprim] Swelling  . Ciprofloxacin Swelling  . Pregabalin Swelling  . Felodipine     Other reaction(s): UNKNOWN  . Levaquin [Levofloxacin In D5w] Other (See Comments)    Unknown  . Trimethoprim Other (See Comments)    UNKNOWN   . Codeine Nausea Only  . Morphine Nausea Only  . Penicillins Rash    Patient Measurements: Height: 4\' 11"  (149.9 cm) Weight: 175 lb 6.4 oz (79.561 kg) IBW/kg (Calculated) : 43.2  Vital Signs: Temp: 100.7 F (38.2 C) (07/28 0800) Temp Source: Oral (07/28 0800) BP: 91/54 mmHg (07/28 1000) Pulse Rate: 51 (07/28 0900) Intake/Output from previous day: 07/27 0701 - 07/28 0700 In: 124.3 [I.V.:124.3] Out: 1100 [Urine:1100] Intake/Output from this shift:    Labs:  Recent Labs  2014-09-17 0814  WBC 21.8*  HGB 11.0*  HCT 36.8  PLT 174     Recent Labs  2014-09-17 0814  NA 148*  K 2.8*  CL 120*  CO2 12*  GLUCOSE 62*  BUN 20  CREATININE 1.68*  CALCIUM 5.2*  MG 1.1*  PHOS 2.5  PROT 3.2*  ALBUMIN 1.2*  AST 47*  ALT 11*  ALKPHOS 39  BILITOT 0.9   Estimated Creatinine Clearance: 23.6 mL/min (by C-G formula based on Cr of 1.68).    Recent Labs  09/11/14 2135 09/12/14 1117  GLUCAP 134* 132*    Medical History: Past Medical History  Diagnosis Date  . COPD (chronic obstructive pulmonary disease)   . Vertigo   . Barrett esophagus   . Lymphoma of lymph nodes   . A-fib   . Pituitary macroadenoma     Medications:  Scheduled:  . antiseptic oral rinse  7 mL Mouth Rinse QID  . budesonide (PULMICORT) nebulizer solution  0.5 mg Nebulization BID  . calcium gluconate  2 g Intravenous Once  . chlorhexidine  15 mL Mouth Rinse BID  . Chlorhexidine Gluconate Cloth  6 each Topical Q0600  . fluconazole (DIFLUCAN) IV  100 mg Intravenous Q24H  . hydrocortisone sod  succinate (SOLU-CORTEF) inj  60 mg Intravenous Q6H  . ipratropium-albuterol  3 mL Nebulization Q4H  . magnesium sulfate 1 - 4 g bolus IVPB  4 g Intravenous Once  . mupirocin ointment  1 application Nasal BID  . nystatin  5 mL Oral QID  . pantoprazole (PROTONIX) IV  40 mg Intravenous Q12H  . piperacillin-tazobactam (ZOSYN)  IV  3.375 g Intravenous Q8H  . potassium chloride  10 mEq Intravenous Q1 Hr x 4  . vancomycin  750 mg Intravenous Q18H   Infusions:  . amiodarone 60 mg/hr (09/17/2014 0741)  . amiodarone    . fentaNYL infusion INTRAVENOUS 100 mcg/hr (09/17/14 0600)  . norepinephrine (LEVOPHED) Adult infusion 50 mcg/min (17-Sep-2014 0828)  .  sodium bicarbonate 150 mEq in sterile water 1000 mL infusion 100 mL/hr at 09/17/14 0600  . vasopressin (PITRESSIN) infusion - *FOR SHOCK* 0.03 Units/min (09/17/2014 1058)   PRN: Place/Maintain arterial line **AND** sodium chloride, fentaNYL (SUBLIMAZE) injection, midazolam, morphine injection, ondansetron (ZOFRAN) IV, promethazine, sodium chloride  Assessment: Pharmacy consulted to manage electrolytes in this 81 y/o F with multiorgan failure intubated, sedated.   Plan:  Will replace potassium with KCL 10 meq iv x 4, magnesium with mag sulfate 4 g iv once, and calcium with calcium gluconate 2 g iv once. Will recheck potassium level this PM and replace as  necessary.   Ulice Dash D 2014/10/02,11:02 AM

## 2014-09-17 NOTE — Progress Notes (Signed)
   09/17/2014 1130  Clinical Encounter Type  Visited With Patient and family together  Visit Type Spiritual support  Referral From Chaplain  Consult/Referral To Chaplain  Spiritual Encounters  Spiritual Needs Ritual;Prayer;Emotional;Grief support;Sacred text  Stress Factors  Patient Stress Factors Exhausted;Major life changes;Loss  Family Stress Factors Exhausted;Family relationships;Health changes;Loss;Major life changes  Met w/patient & family. Patient not responsive. Family requested last rites. Provided sacrament & prayer. Chap. Jayani Rozman G. Mansura

## 2014-09-17 NOTE — Progress Notes (Signed)
Pt made comfort care per family request Dr. Megan Salon currently at bedside with pts family. 4 mg iv push morphine administered per Dr Chilton Greathouse verbal orders; PRN order for 4 mg morphine q15 min prn placed per Dr Hale Bogus verbal orders; will ontinue to monitor and assess pt.

## 2014-09-17 NOTE — Progress Notes (Signed)
Nutrition Brief Note  Chart reviewed. Pt now transitioning to comfort care.  No further nutrition interventions warranted at this time.  Please re-consult as needed.   Kerman Passey Bassett, [place], LDN [phone] Pager

## 2014-09-17 NOTE — Consult Note (Signed)
Palliative Medicine Inpatient Consult Follow Up Note   Name: Tricia Miller Date: 10/06/14 MRN: [id number]  DOB: [birth year]  Referring Physician: Epifanio Lesches, MD  Pt has been made comfort care by two daughters and a '76 -daughter' who are present.  Also, the patient's son, who is in [place], agrees with terminal comfort care (he would not be able to fly out and get here reasonably quickly).   She had apparently aspirated acutely yesterday, but the mucous plugs and sepsis indicate that perhaps she has been experiencing some aspiration events (likely silent aspiration) prior to this single episode. She has been on 2 pressors and also on Amiodarone for heart rate control.  She was getting multiple IV meds and her vital signs and labs showed signs of a non-recoverable situation. Her lactic acid was  15.5 and ph was 7.14 earlier today.  Family had made her comfort care but then the next step was to discuss terminal extubation.  Family talked with other family members and all are in agreement that terminal extubation at this time is in order.  Patient was seen by me prior to extubation and at that time she was alive, but only by virtue of the many life support systems sustaining her life.    REVIEW OF SYSTEMS:  Patient is not able to provide ROS  CODE STATUS: DNR   PAST MEDICAL HISTORY: Past Medical History  Diagnosis Date  . COPD (chronic obstructive pulmonary disease)   . Vertigo   . Barrett esophagus   . Lymphoma of lymph nodes   . A-fib   . Pituitary macroadenoma     PAST SURGICAL HISTORY:  Past Surgical History  Procedure Laterality Date  . Abdominal hysterectomy    . Pituitary surgery    . Orthopedic surgery    . Esophagogastroduodenoscopy (egd) with propofol Left 09/09/2014    Procedure: ESOPHAGOGASTRODUODENOSCOPY (EGD) WITH PROPOFOL with possible dilation;  Surgeon: Manya Silvas, MD;  Location: [facility] ENDOSCOPY;  Service: Endoscopy;  Laterality: Left;     Vital Signs: BP 97/61 mmHg  Pulse 51  Temp(Src) 100.7 F (38.2 C) (Oral)  Resp 20  Ht 4\' 11"  (1.499 m)  Wt 79.561 kg (175 lb 6.4 oz)  BMI 35.41 kg/m2  SpO2 99% Filed Weights   09/11/2014 1240 08/22/2014 2102  Weight: 80.695 kg (177 lb 14.4 oz) 79.561 kg (175 lb 6.4 oz)    Estimated body mass index is 35.41 kg/(m^2) as calculated from the following:   Height as of this encounter: 4\' 11"  (1.499 m).   Weight as of this encounter: 79.561 kg (175 lb 6.4 oz).  PHYSICAL EXAM: Sedated and ventilated Appears without grimaces or restlessness at this time Hrt irreg irreg tachy Lungs with vent sounds and ronchi throughout Abd soft, rare BS Ext pallor noted.   LABS: CBC:    Component Value Date/Time   WBC 21.8* October 06, 2014 0814   HGB 11.0* 2014-10-06 0814   HCT 36.8 10-06-14 0814   PLT 174 10/06/2014 0814   MCV 84.5 10-06-14 0814   NEUTROABS 7.2 10/06/2014 0814   LYMPHSABS 13.5* 06-Oct-2014 0814   MONOABS 1.1* 2014-10-06 0814   EOSABS 0.0 October 06, 2014 0814   BASOSABS 0.0 October 06, 2014 0814   Comprehensive Metabolic Panel:    Component Value Date/Time   NA 148* 10/06/14 0814   K 2.8* 10/06/14 0814   CL 120* Oct 06, 2014 0814   CO2 12* October 06, 2014 0814   BUN 20 October 06, 2014 0814   CREATININE 1.68* 2014-10-06 0814   GLUCOSE 62*  October 03, 2014 0814   CALCIUM 5.2* 2014-10-03 0814   AST 47* 10-03-14 0814   ALT 11* Oct 03, 2014 0814   ALKPHOS 39 10/03/2014 0814   BILITOT 0.9 10/03/2014 0814   PROT 3.2* 2014/10/03 0814   ALBUMIN 1.2* Oct 03, 2014 9977    IMPRESSION: 1) Sepsis  2) Acute Respiratory Failure  3) Aspiration 4) Electrolyte Dissaray 5) Ondynophagia 6) Metabolic Acidosis with severe Lactic Acidosis  PLAN: Terminal Extubation is to be initiated with comfort measures also started.       More than 50% of the visit was spent in counseling/coordination of care: YES  Time Spent: 60 minutes

## 2014-09-17 NOTE — Consult Note (Signed)
After further assessment and discussion with family, patient with worsening multiorgan failure, family is aware and have decided  to proceed with Comfort Care measures.  They will contact family and decide on time of comfort care. ICU team awake of plan  Daughters are satisfied with Plan of care and management

## 2014-09-17 NOTE — Progress Notes (Signed)
Pt expired at 15:13 pronounced by Palliative Care MD Dr. Megan Salon; Pt asystole on cardiac monitor.  Dr Vianne Bulls attending physician notified of time of death.  Notified [employer] spoke with representative Ronna Polio she stated pt does not fit criteria for donation and may release body to funeral home.  Reference number [id number].  Nursing supervisor Kennyth Lose notified.  Patients body to be released to [employer]

## 2014-09-17 NOTE — Consult Note (Signed)
Palliative Care Update Note  Pt was terminally extubated with comfort measures in place.  Her two daughters and a family friend were present/ near the room.  After extubation, patient had a few agonal respirations but these soon stopped and she was without pulse, heartbeat, breath sounds, or corneal reflexes and is pronounced dead at 3:13 pm today.  Cause of death is felt to be sepsis with multi-organ failure with the associated problem of aspiration pneumonia and acute respiratory failure.    Colleen Can, MD

## 2014-09-17 DEATH — deceased

## 2014-09-20 ENCOUNTER — Ambulatory Visit: Payer: Medicare Other | Admitting: Gastroenterology

## 2017-01-12 IMAGING — CR DG CHEST 1V PORT
1 series · 1 of 1 positions shown · non-contrast
Comparison: None.

CLINICAL DATA: Initial encounter for shortness of breath

EXAM:
PORTABLE CHEST - 1 VIEW

[ap]
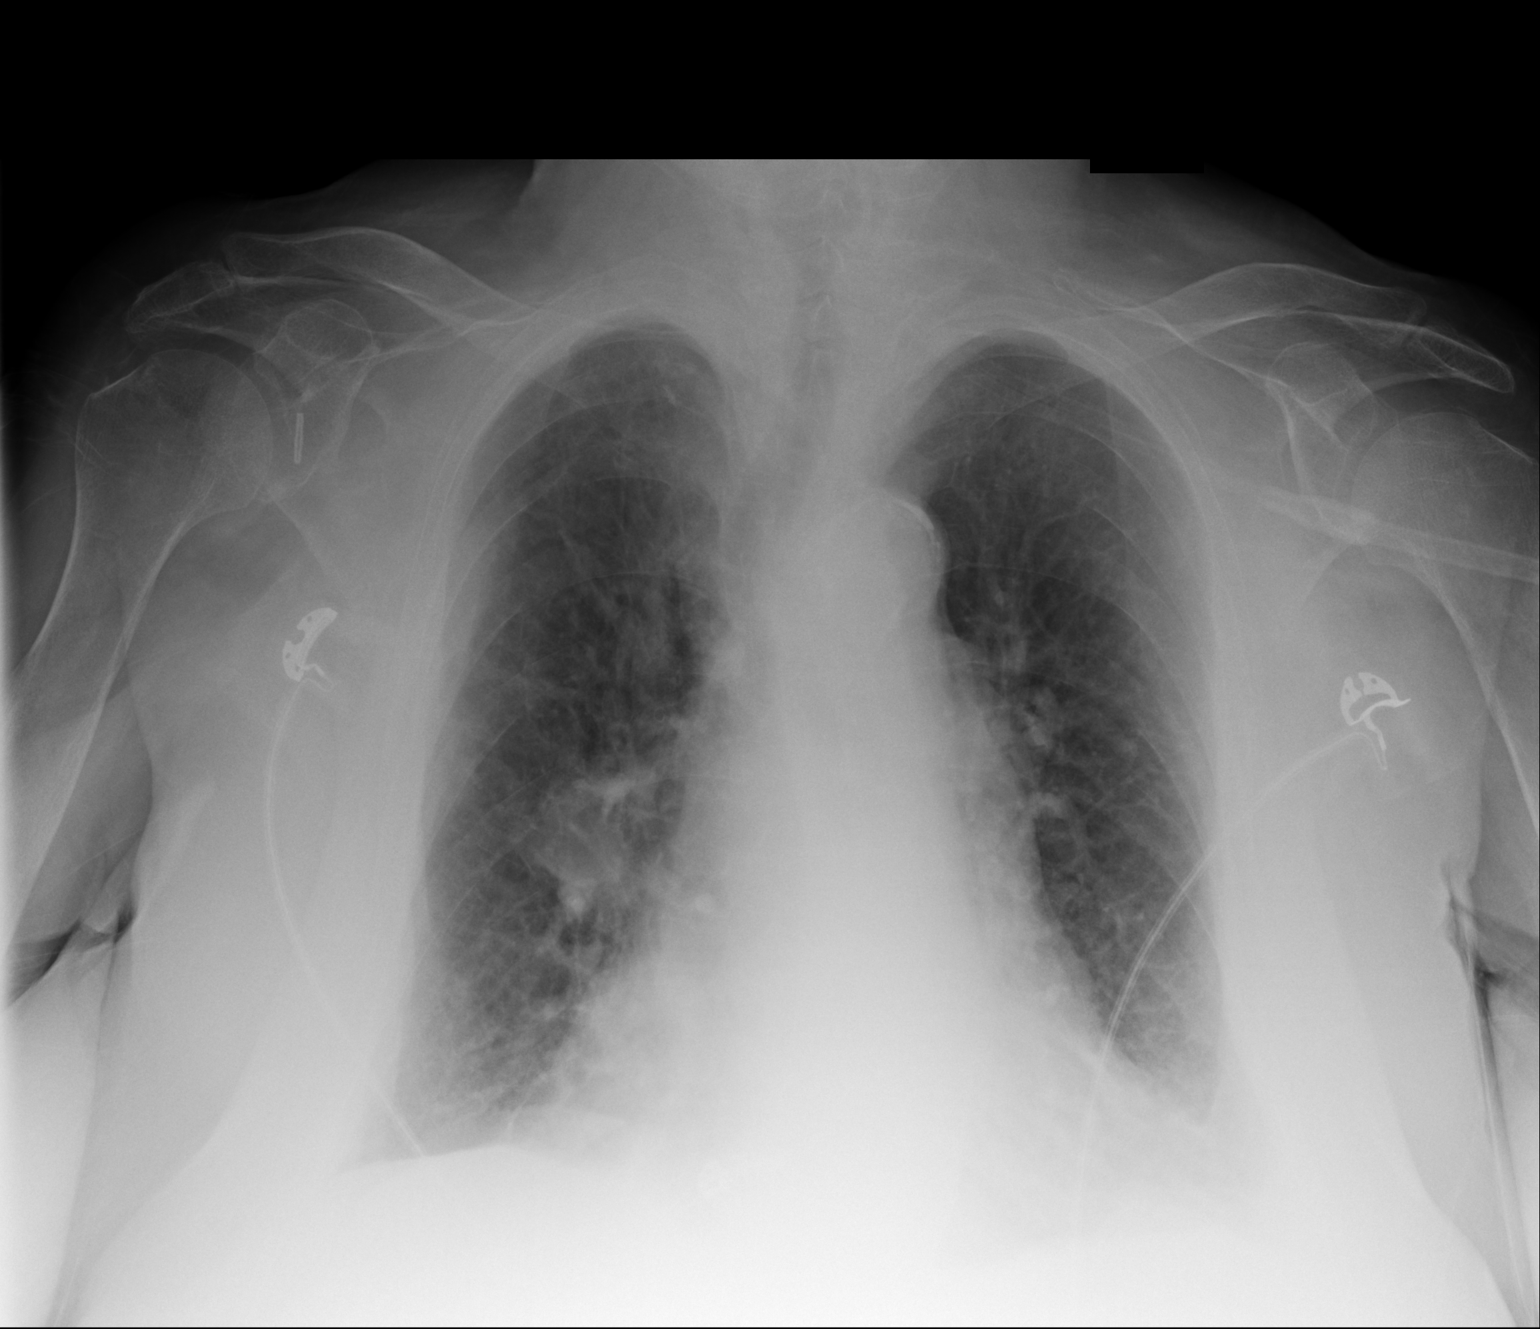

[1 of 1 positions shown; findings below may reference images not displayed]

FINDINGS: 6123 hrs. lungs are hyperexpanded with vascular congestion. No overt
pulmonary edema. No focal airspace consolidation. Focal opacity is
seen in the right hilum and right suprahilar region. The cardio
pericardial silhouette is enlarged. Bones are diffusely
demineralized. Telemetry leads overlie the chest.
IMPRESSION: Cardiomegaly with features suggesting emphysema.

Right hilar and suprahilar opacity. Neoplasm is a concern. CT chest
with contrast recommended to further evaluate.

These results will be called to the ordering clinician or
representative by the Radiologist Assistant, and communication
documented in the PACS or zVision Dashboard.

## 2017-02-08 IMAGING — CT CT ABD-PELV W/ CM
1 of 3 series · 14 of 32 positions shown, 19 images · IV contrast (omnipaque)
Comparison: Radiographs 09/09/2014

CLINICAL DATA: Vomiting weakness and productive cough. Worsened
since last night.

EXAM:
CT ABDOMEN AND PELVIS WITH CONTRAST
TECHNIQUE: Multidetector CT imaging of the abdomen and pelvis was performed
using the standard protocol following bolus administration of
intravenous contrast.
CONTRAST:  100mL OMNIPAQUE IOHEXOL 300 MG/ML  SOLN

[Series 2: routine abd pel with · axial · 0.88mm/px · z∈[-506,-96]mm · 14 of 94 slices shown, 19 images]
[im 6/94  soft-tissue]
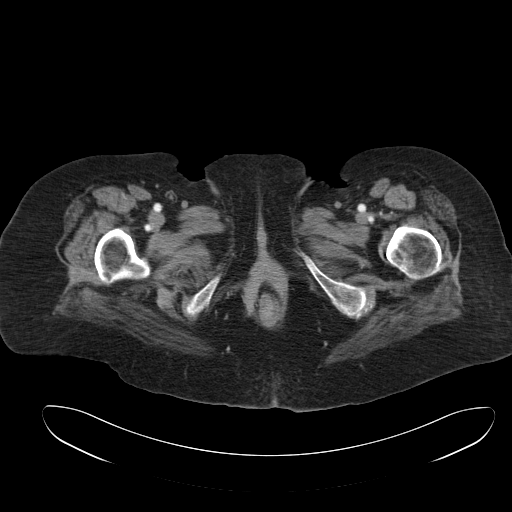
[im 6/94  bone]
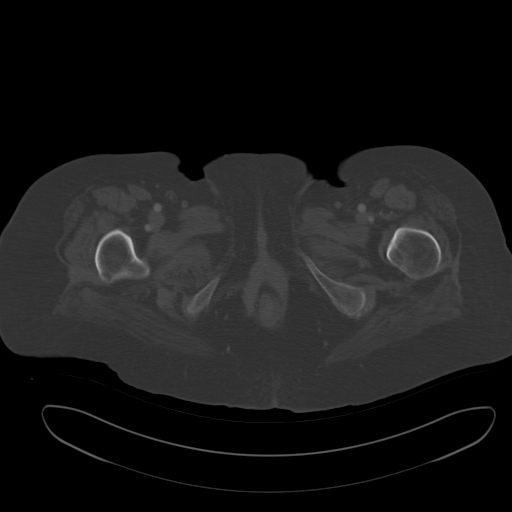
[im 12/94  soft-tissue]
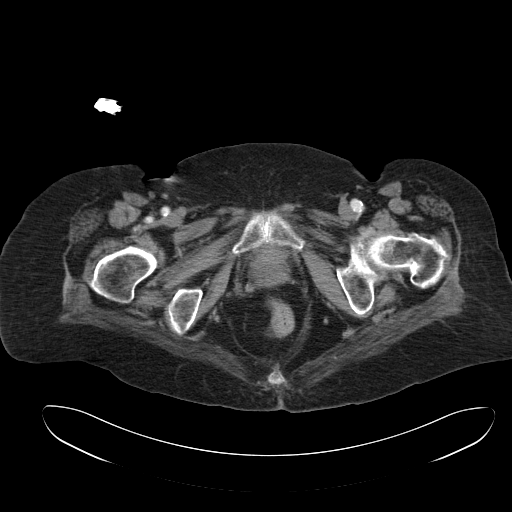
[im 18/94  soft-tissue]
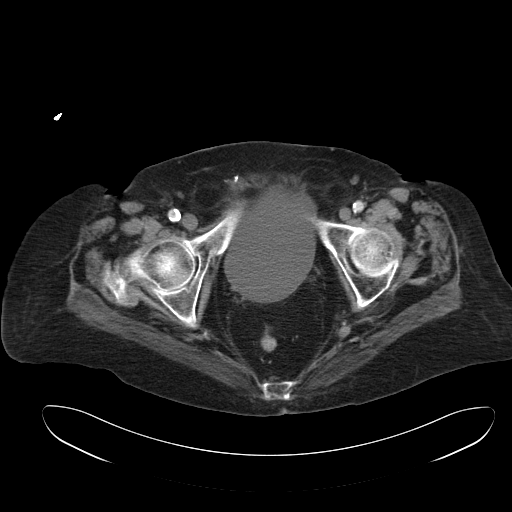
[im 30/94  soft-tissue]
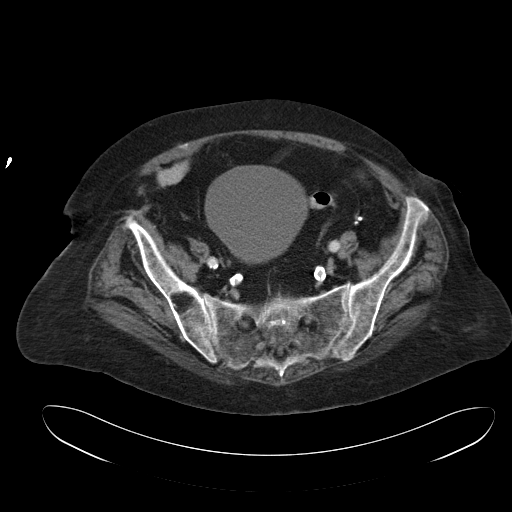
[im 35/94  soft-tissue]
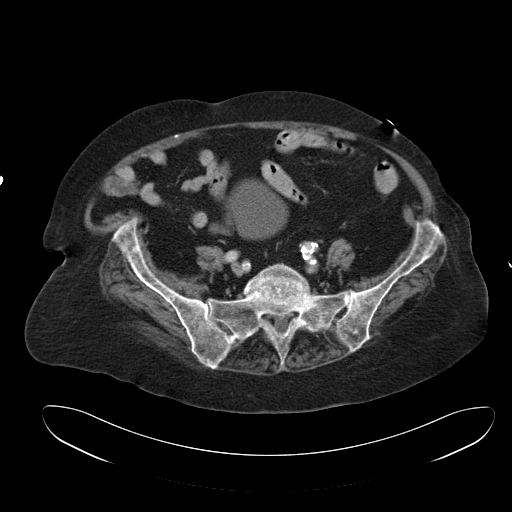
[im 41/94  soft-tissue]
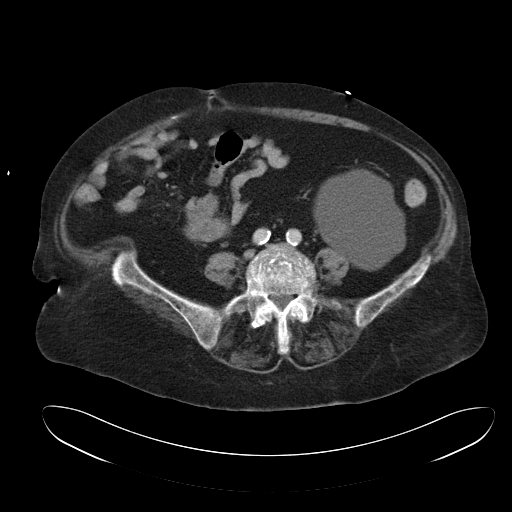
[im 47/94  soft-tissue]
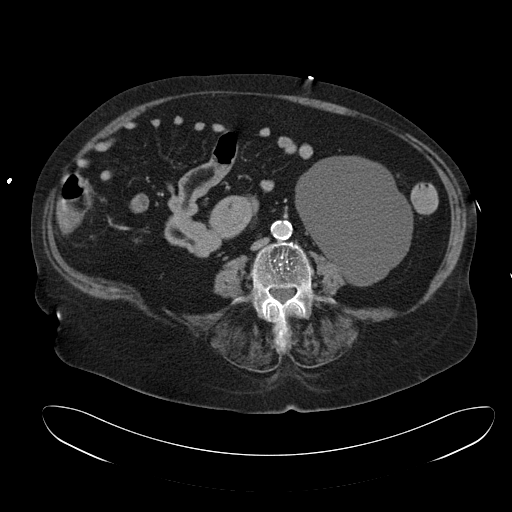
[im 53/94  soft-tissue]
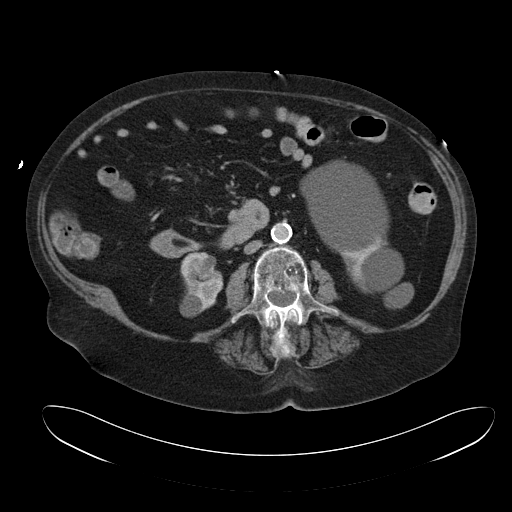
[im 59/94  soft-tissue]
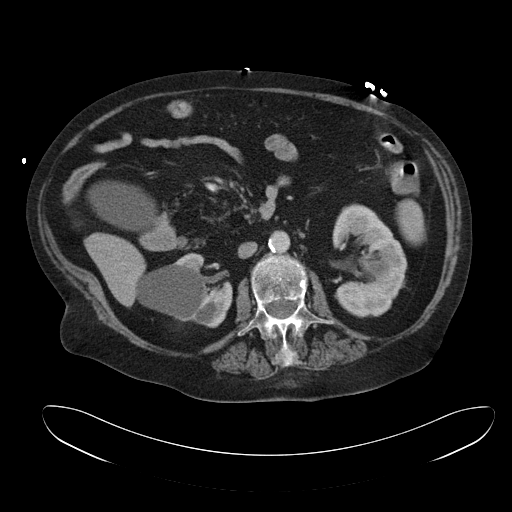
[im 59/94  bone]
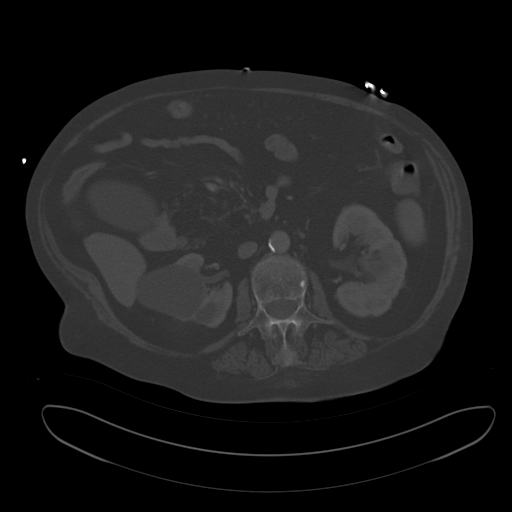
[im 64/94  soft-tissue]
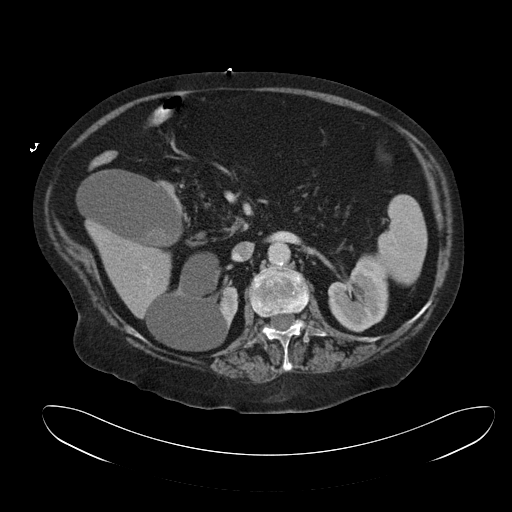
[im 70/94  lung]
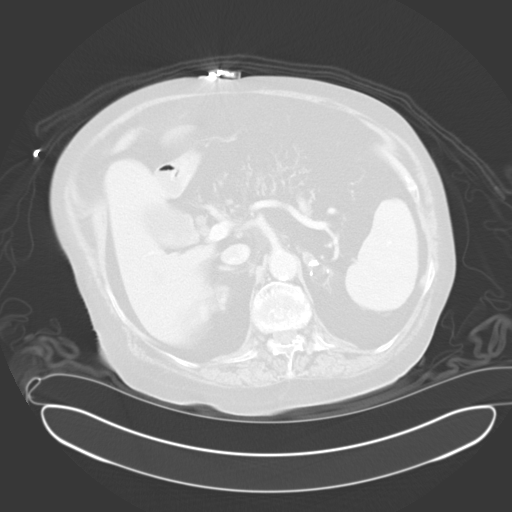
[im 76/94  soft-tissue]
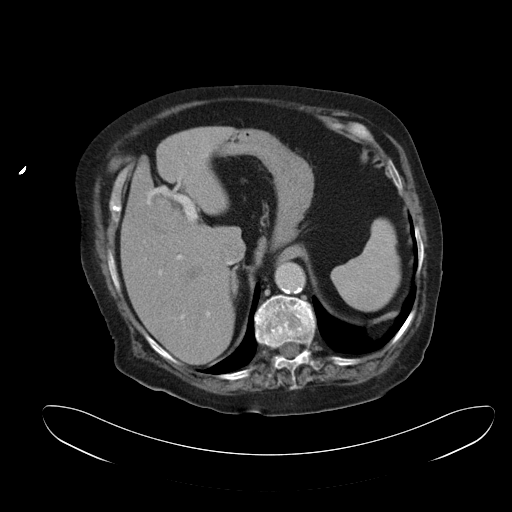
[im 76/94  lung]
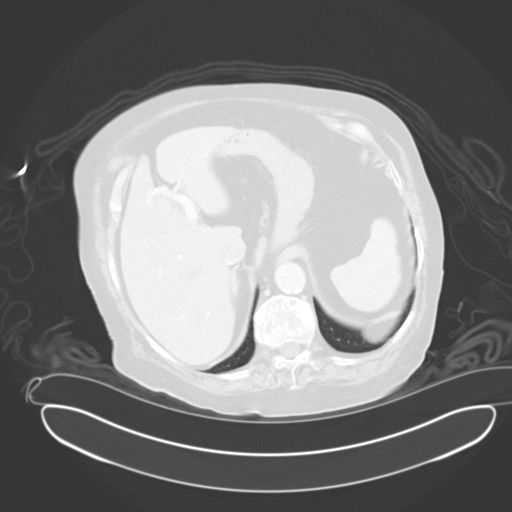
[im 82/94  soft-tissue]
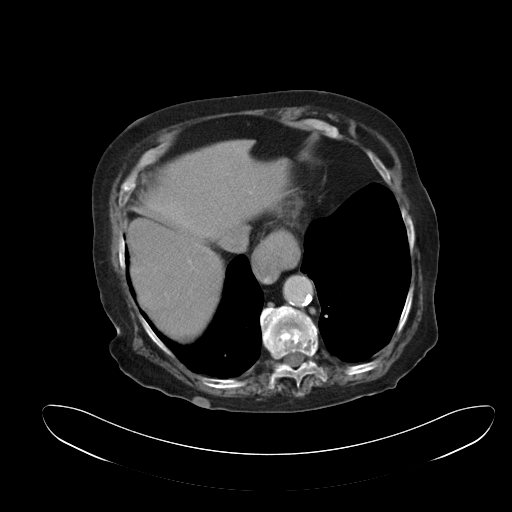
[im 82/94  lung]
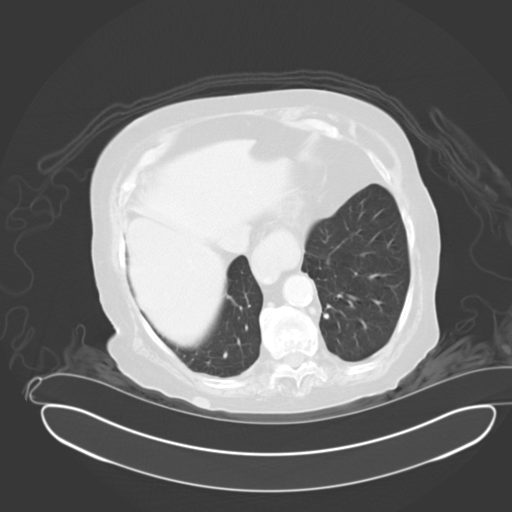
[im 88/94  soft-tissue]
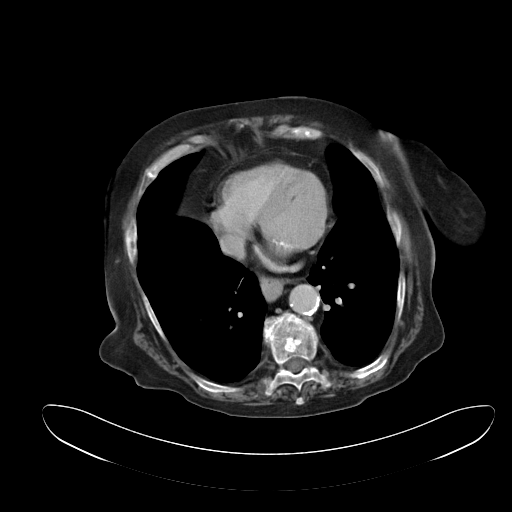
[im 88/94  lung]
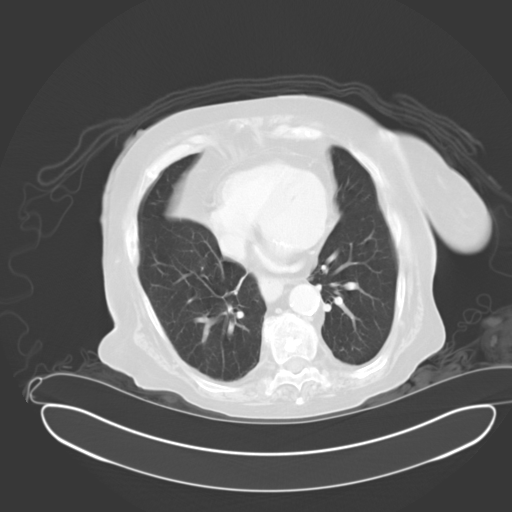

[14 of 32 positions shown; findings below may reference images not displayed]

FINDINGS: Lower chest: No significant abnormality. Minor linear basilar
scarring.

Hepatobiliary: There are multiple calculi within the gallbladder
lumen. There is no gallbladder mural thickening or pericholecystic
inflammatory change. There are multiple benign hepatic cysts
measuring up to 2.7 cm. There is no significant liver lesion. There
is no bile duct dilatation.

Pancreas: Fatty replaced. No pancreatic mass or ductal dilatation.
No inflammatory changes.

Spleen: Normal

Adrenals/Urinary Tract: Benign left adrenal nodule containing fat
and calcium consistent with a 1.8 cm myelolipoma. There are large
benign renal cysts bilaterally, measuring up to 9.5 cm on the left
and 7.3 cm on the right. Ureters and urinary bladder appear
unremarkable.

Stomach/Bowel: There is a large hiatal hernia. The stomach, small
bowel and colon are otherwise remarkable only for minimal
uncomplicated colonic diverticulosis.

Vascular/Lymphatic: The abdominal aorta is normal in caliber. There
is moderate atherosclerotic calcification. There is no adenopathy in
the abdomen or pelvis.

Reproductive: Hysterectomy.  No adnexal abnormalities.

Other: No acute inflammatory changes are evident in the abdomen or
pelvis. There is no ascites.

Musculoskeletal: Multiple vertebral hemangiomata. No significant
musculoskeletal lesion. Small fat containing inguinal hernias
incidentally noted bilaterally.
IMPRESSION: 1. No acute findings.
2. Cholelithiasis
3. Benign fat containing left adrenal nodule
4. Large hiatal hernia
5. Benign cysts of the liver and kidneys.
